# Patient Record
Sex: Male | Born: 1950
Health system: Southern US, Community
[De-identification: ages and names within clinical notes are randomized; demographics above are authoritative.]

## PROBLEM LIST (undated history)

## (undated) DIAGNOSIS — R030 Elevated blood-pressure reading, without diagnosis of hypertension: Secondary | ICD-10-CM

## (undated) DIAGNOSIS — I2699 Other pulmonary embolism without acute cor pulmonale: Secondary | ICD-10-CM

## (undated) DIAGNOSIS — E663 Overweight: Secondary | ICD-10-CM

## (undated) DIAGNOSIS — J309 Allergic rhinitis, unspecified: Secondary | ICD-10-CM

## (undated) DIAGNOSIS — N200 Calculus of kidney: Secondary | ICD-10-CM

## (undated) DIAGNOSIS — I480 Paroxysmal atrial fibrillation: Secondary | ICD-10-CM

## (undated) DIAGNOSIS — I491 Atrial premature depolarization: Secondary | ICD-10-CM

## (undated) DIAGNOSIS — R0609 Other forms of dyspnea: Secondary | ICD-10-CM

## (undated) DIAGNOSIS — E785 Hyperlipidemia, unspecified: Secondary | ICD-10-CM

## (undated) HISTORY — DX: Elevated blood-pressure reading, without diagnosis of hypertension: R03.0

## (undated) HISTORY — DX: Other pulmonary embolism without acute cor pulmonale: I26.99

## (undated) HISTORY — DX: Overweight: E66.3

## (undated) HISTORY — DX: Allergic rhinitis, unspecified: J30.9

## (undated) HISTORY — DX: Paroxysmal atrial fibrillation: I48.0

## (undated) HISTORY — PX: TRANSTHORACIC ECHOCARDIOGRAM: SHX275

## (undated) HISTORY — DX: Hyperlipidemia, unspecified: E78.5

## (undated) HISTORY — DX: Calculus of kidney: N20.0

## (undated) HISTORY — DX: Other forms of dyspnea: R06.09

## (undated) HISTORY — DX: Atrial premature depolarization: I49.1

---

## 2018-02-04 DIAGNOSIS — L821 Other seborrheic keratosis: Secondary | ICD-10-CM | POA: Diagnosis not present

## 2018-02-04 DIAGNOSIS — D225 Melanocytic nevi of trunk: Secondary | ICD-10-CM | POA: Diagnosis not present

## 2018-02-04 DIAGNOSIS — L814 Other melanin hyperpigmentation: Secondary | ICD-10-CM | POA: Diagnosis not present

## 2018-02-04 DIAGNOSIS — D1801 Hemangioma of skin and subcutaneous tissue: Secondary | ICD-10-CM | POA: Diagnosis not present

## 2018-03-09 DIAGNOSIS — H6980 Other specified disorders of Eustachian tube, unspecified ear: Secondary | ICD-10-CM | POA: Diagnosis not present

## 2018-09-17 DIAGNOSIS — L814 Other melanin hyperpigmentation: Secondary | ICD-10-CM | POA: Diagnosis not present

## 2018-09-17 DIAGNOSIS — D225 Melanocytic nevi of trunk: Secondary | ICD-10-CM | POA: Diagnosis not present

## 2018-09-17 DIAGNOSIS — L821 Other seborrheic keratosis: Secondary | ICD-10-CM | POA: Diagnosis not present

## 2018-09-17 DIAGNOSIS — I8393 Asymptomatic varicose veins of bilateral lower extremities: Secondary | ICD-10-CM | POA: Diagnosis not present

## 2018-10-08 DIAGNOSIS — Z23 Encounter for immunization: Secondary | ICD-10-CM | POA: Diagnosis not present

## 2018-11-10 HISTORY — PX: OTHER SURGICAL HISTORY: SHX169

## 2018-11-10 HISTORY — PX: TRANSTHORACIC ECHOCARDIOGRAM: SHX275

## 2018-11-13 DIAGNOSIS — R0602 Shortness of breath: Secondary | ICD-10-CM | POA: Diagnosis not present

## 2018-11-18 ENCOUNTER — Encounter: Payer: Self-pay | Admitting: Cardiology

## 2018-11-18 DIAGNOSIS — I499 Cardiac arrhythmia, unspecified: Secondary | ICD-10-CM | POA: Diagnosis not present

## 2018-11-18 DIAGNOSIS — E785 Hyperlipidemia, unspecified: Secondary | ICD-10-CM | POA: Diagnosis not present

## 2018-11-18 DIAGNOSIS — R5383 Other fatigue: Secondary | ICD-10-CM | POA: Diagnosis not present

## 2018-11-18 DIAGNOSIS — I10 Essential (primary) hypertension: Secondary | ICD-10-CM | POA: Diagnosis not present

## 2018-11-18 DIAGNOSIS — Z131 Encounter for screening for diabetes mellitus: Secondary | ICD-10-CM | POA: Diagnosis not present

## 2018-11-21 ENCOUNTER — Other Ambulatory Visit: Payer: Self-pay | Admitting: Physician Assistant

## 2018-11-21 DIAGNOSIS — I499 Cardiac arrhythmia, unspecified: Secondary | ICD-10-CM

## 2018-11-28 ENCOUNTER — Other Ambulatory Visit: Payer: Self-pay | Admitting: Physician Assistant

## 2018-11-28 ENCOUNTER — Ambulatory Visit (INDEPENDENT_AMBULATORY_CARE_PROVIDER_SITE_OTHER): Payer: BLUE CROSS/BLUE SHIELD

## 2018-11-28 DIAGNOSIS — R0602 Shortness of breath: Secondary | ICD-10-CM | POA: Diagnosis not present

## 2018-11-28 DIAGNOSIS — I499 Cardiac arrhythmia, unspecified: Secondary | ICD-10-CM | POA: Diagnosis not present

## 2018-11-29 DIAGNOSIS — R0602 Shortness of breath: Secondary | ICD-10-CM | POA: Diagnosis not present

## 2018-11-29 DIAGNOSIS — I499 Cardiac arrhythmia, unspecified: Secondary | ICD-10-CM | POA: Diagnosis not present

## 2018-12-01 ENCOUNTER — Emergency Department (HOSPITAL_COMMUNITY): Payer: BLUE CROSS/BLUE SHIELD

## 2018-12-01 ENCOUNTER — Observation Stay (HOSPITAL_COMMUNITY)
Admission: EM | Admit: 2018-12-01 | Discharge: 2018-12-05 | DRG: 176 | Disposition: A | Discharge status: Home / Self Care | Payer: BLUE CROSS/BLUE SHIELD | Attending: Internal Medicine | Admitting: Internal Medicine

## 2018-12-01 ENCOUNTER — Encounter (HOSPITAL_COMMUNITY): Payer: Self-pay | Admitting: Emergency Medicine

## 2018-12-01 ENCOUNTER — Other Ambulatory Visit: Payer: Self-pay

## 2018-12-01 DIAGNOSIS — R7989 Other specified abnormal findings of blood chemistry: Secondary | ICD-10-CM | POA: Diagnosis not present

## 2018-12-01 DIAGNOSIS — N189 Chronic kidney disease, unspecified: Secondary | ICD-10-CM | POA: Diagnosis present

## 2018-12-01 DIAGNOSIS — R509 Fever, unspecified: Secondary | ICD-10-CM

## 2018-12-01 DIAGNOSIS — I340 Nonrheumatic mitral (valve) insufficiency: Secondary | ICD-10-CM | POA: Diagnosis not present

## 2018-12-01 DIAGNOSIS — I2694 Multiple subsegmental pulmonary emboli without acute cor pulmonale: Secondary | ICD-10-CM | POA: Diagnosis not present

## 2018-12-01 DIAGNOSIS — I493 Ventricular premature depolarization: Secondary | ICD-10-CM | POA: Diagnosis not present

## 2018-12-01 DIAGNOSIS — I2699 Other pulmonary embolism without acute cor pulmonale: Secondary | ICD-10-CM

## 2018-12-01 DIAGNOSIS — I370 Nonrheumatic pulmonary valve stenosis: Secondary | ICD-10-CM | POA: Diagnosis not present

## 2018-12-01 DIAGNOSIS — I2609 Other pulmonary embolism with acute cor pulmonale: Secondary | ICD-10-CM | POA: Diagnosis present

## 2018-12-01 DIAGNOSIS — I480 Paroxysmal atrial fibrillation: Secondary | ICD-10-CM

## 2018-12-01 DIAGNOSIS — N179 Acute kidney failure, unspecified: Secondary | ICD-10-CM | POA: Diagnosis present

## 2018-12-01 DIAGNOSIS — R0902 Hypoxemia: Secondary | ICD-10-CM | POA: Diagnosis not present

## 2018-12-01 DIAGNOSIS — I4891 Unspecified atrial fibrillation: Secondary | ICD-10-CM

## 2018-12-01 DIAGNOSIS — W541XXA Struck by dog, initial encounter: Secondary | ICD-10-CM | POA: Diagnosis not present

## 2018-12-01 DIAGNOSIS — J984 Other disorders of lung: Secondary | ICD-10-CM | POA: Diagnosis not present

## 2018-12-01 DIAGNOSIS — I82452 Acute embolism and thrombosis of left peroneal vein: Secondary | ICD-10-CM | POA: Diagnosis present

## 2018-12-01 DIAGNOSIS — Z791 Long term (current) use of non-steroidal anti-inflammatories (NSAID): Secondary | ICD-10-CM | POA: Diagnosis not present

## 2018-12-01 DIAGNOSIS — R0602 Shortness of breath: Secondary | ICD-10-CM | POA: Diagnosis not present

## 2018-12-01 DIAGNOSIS — I129 Hypertensive chronic kidney disease with stage 1 through stage 4 chronic kidney disease, or unspecified chronic kidney disease: Secondary | ICD-10-CM | POA: Diagnosis present

## 2018-12-01 DIAGNOSIS — I361 Nonrheumatic tricuspid (valve) insufficiency: Secondary | ICD-10-CM | POA: Diagnosis not present

## 2018-12-01 HISTORY — DX: Other pulmonary embolism without acute cor pulmonale: I26.99

## 2018-12-01 LAB — HEPARIN LEVEL (UNFRACTIONATED): Heparin Unfractionated: 0.39 IU/mL (ref 0.30–0.70)

## 2018-12-01 LAB — MAGNESIUM: Magnesium: 2.2 mg/dL (ref 1.7–2.4)

## 2018-12-01 LAB — COMPREHENSIVE METABOLIC PANEL
ALT: 27 U/L (ref 0–44)
AST: 55 U/L — ABNORMAL HIGH (ref 15–41)
Albumin: 3.7 g/dL (ref 3.5–5.0)
Alkaline Phosphatase: 58 U/L (ref 38–126)
Anion gap: 16 — ABNORMAL HIGH (ref 5–15)
BUN: 20 mg/dL (ref 8–23)
CO2: 21 mmol/L — ABNORMAL LOW (ref 22–32)
Calcium: 9.6 mg/dL (ref 8.9–10.3)
Chloride: 102 mmol/L (ref 98–111)
Creatinine, Ser: 1.43 mg/dL — ABNORMAL HIGH (ref 0.61–1.24)
GFR calc Af Amer: 59 mL/min — ABNORMAL LOW (ref >60)
GFR calc non Af Amer: 51 mL/min — ABNORMAL LOW (ref >60)
Glucose, Bld: 144 mg/dL — ABNORMAL HIGH (ref 70–99)
Potassium: 3.9 mmol/L (ref 3.5–5.1)
Sodium: 139 mmol/L (ref 135–145)
Total Bilirubin: 1.2 mg/dL (ref 0.3–1.2)
Total Protein: 7.7 g/dL (ref 6.5–8.1)

## 2018-12-01 LAB — CBC
HCT: 49.8 % (ref 39.0–52.0)
Hemoglobin: 16.5 g/dL (ref 13.0–17.0)
MCH: 28.4 pg (ref 26.0–34.0)
MCHC: 33.1 g/dL (ref 30.0–36.0)
MCV: 85.6 fL (ref 80.0–100.0)
Platelets: 280 10*3/uL (ref 150–400)
RBC: 5.82 MIL/uL — ABNORMAL HIGH (ref 4.22–5.81)
RDW: 13.2 % (ref 11.5–15.5)
WBC: 13.7 10*3/uL — ABNORMAL HIGH (ref 4.0–10.5)
nRBC: 0 % (ref 0.0–0.2)

## 2018-12-01 LAB — ANTITHROMBIN III: AntiThromb III Func: 86 % (ref 75–120)

## 2018-12-01 LAB — TROPONIN I: Troponin I: 0.21 ng/mL (ref <0.03)

## 2018-12-01 MED ORDER — DILTIAZEM HCL-DEXTROSE 100-5 MG/100ML-% IV SOLN (PREMIX)
5.0000 mg/h | INTRAVENOUS | Status: DC
  Administered 2018-12-01: 5 mg/h via INTRAVENOUS
  Administered 2018-12-01 – 2018-12-02 (×2): 10 mg/h via INTRAVENOUS
  Filled 2018-12-01 (×4): qty 100

## 2018-12-01 MED ORDER — IOPAMIDOL (ISOVUE-370) INJECTION 76%
INTRAVENOUS | Status: AC
  Administered 2018-12-01: 75 mL
  Filled 2018-12-01: qty 100

## 2018-12-01 MED ORDER — HEPARIN BOLUS VIA INFUSION
5000.0000 [IU] | Freq: Once | INTRAVENOUS | Status: AC
  Administered 2018-12-01: 5000 [IU] via INTRAVENOUS
  Filled 2018-12-01: qty 5000

## 2018-12-01 MED ORDER — DILTIAZEM LOAD VIA INFUSION
10.0000 mg | Freq: Once | INTRAVENOUS | Status: AC
  Administered 2018-12-01: 10 mg via INTRAVENOUS
  Filled 2018-12-01: qty 10

## 2018-12-01 MED ORDER — ACETAMINOPHEN 650 MG RE SUPP: 650.0000 mg | Freq: Four times a day (QID) | RECTAL | Status: DC | PRN

## 2018-12-01 MED ORDER — HEPARIN (PORCINE) 25000 UT/250ML-% IV SOLN
1900.0000 [IU]/h | INTRAVENOUS | Status: DC
  Administered 2018-12-01: 1600 [IU]/h via INTRAVENOUS
  Administered 2018-12-02: 1800 [IU]/h via INTRAVENOUS
  Administered 2018-12-02: 1600 [IU]/h via INTRAVENOUS
  Administered 2018-12-03: 1900 [IU]/h via INTRAVENOUS
  Administered 2018-12-03: 1800 [IU]/h via INTRAVENOUS
  Administered 2018-12-04: 1900 [IU]/h via INTRAVENOUS
  Filled 2018-12-01 (×6): qty 250

## 2018-12-01 MED ORDER — ACETAMINOPHEN 325 MG PO TABS
650.0000 mg | ORAL_TABLET | Freq: Four times a day (QID) | ORAL | Status: DC | PRN
  Administered 2018-12-03 (×2): 650 mg via ORAL
  Filled 2018-12-01 (×2): qty 2

## 2018-12-01 NOTE — H&P (Addendum)
PCP:  Dr Delton SeeNelson  Chief Complaint: Shortness of breath   HPI: this is a 67 y/o male who presents with c/o SOB from early November. He works out and noted his stamina dropped off. He saw his PCP, a heart monitor was placed after Thanksgiving. Turned in this Friday. He continued to be very SOB, taking a shower, walking upstairs he is SOB. This morning he was aggravated by the dog and that set off a chain of events where he was persistently SOB and unable to recover. He finally decided to come into the ER. He travels frequently, he flies frequently 2 or 3 times a month. He denies any calf swelling. He denies any hemoptysis. He reports a cough when he loses his breath, no wheezing. No fevers or chills  In the ER his CTA is significant for a significant PE.  History provided by the patient who is alert and oriented  Review of Systems:  The patient denies anorexia, fever, weight loss, SOB, vision loss, decreased hearing, hoarseness, chest pain, syncope, dyspnea on exertion, peripheral edema, balance deficits, hemoptysis, abdominal pain, melena, hematochezia, severe indigestion/heartburn, hematuria, incontinence, genital sores, muscle weakness, suspicious skin lesions, transient blindness, difficulty walking, depression, unusual weight change, abnormal bleeding, enlarged lymph nodes, angioedema, and breast masses.  Past Medical History: History reviewed. No pertinent past medical history. History reviewed. No pertinent surgical history.  Medications: Prior to Admission medications   Medication Sig Start Date End Date Taking? Authorizing Provider  naproxen sodium (ALEVE) 220 MG tablet Take 440 mg by mouth as needed (pain).   Yes [provider]    Allergies: No known drug allergy  Social History:  reports that he has never smoked. He has never used smokeless tobacco. No history on file for alcohol and drug.  Family History: History reviewed. No pertinent family history.  Physical  Exam: Vitals:   12/01/18 1430 12/01/18 1500 12/01/18 1520 12/01/18 1533  BP: 103/80 103/71 103/71 104/75  Pulse:  (!) 118 (!) 103 (!) 103  Resp:  (!) 25 (!) 26 18  Temp:      TempSrc:      SpO2: 96% 93% 95% 94%  Weight:      Height:        General:  Alert and oriented times three, well developed and nourished, no acute distress Eyes: PERRLA, pink conjunctiva, no scleral icterus ENT: Moist oral mucosa, neck supple, no thyromegaly Lungs: clear to ascultation, no wheeze, no crackles, no use of accessory muscles Cardiovascular: regular rate and rhythm, no regurgitation, no gallops, no murmurs. No carotid bruits, no JVD Abdomen: soft, positive BS, non-tender, non-distended, no organomegaly, not an acute abdomen GU: not examined Neuro: CN II - XII grossly intact, sensation intact Musculoskeletal: strength 5/5 all extremities, no clubbing, cyanosis or edema Skin: no rash, no subcutaneous crepitation, no decubitus Psych: appropriate patient   Labs on Admission:  Recent Labs    12/01/18 1217  NA 139  K 3.9  CL 102  CO2 21*  GLUCOSE 144*  BUN 20  CREATININE 1.43*  CALCIUM 9.6  MG 2.2   Recent Labs    12/01/18 1217  AST 55*  ALT 27  ALKPHOS 58  BILITOT 1.2  PROT 7.7  ALBUMIN 3.7   No results for input(s): LIPASE, AMYLASE in the last 72 hours. Recent Labs    12/01/18 1217  WBC 13.7*  HGB 16.5  HCT 49.8  MCV 85.6  PLT 280   Recent Labs    12/01/18 1217  TROPONINI 0.21*   Invalid input(s): POCBNP No results for input(s): DDIMER in the last 72 hours. No results for input(s): HGBA1C in the last 72 hours. No results for input(s): CHOL, HDL, LDLCALC, TRIG, CHOLHDL, LDLDIRECT in the last 72 hours. No results for input(s): TSH, T4TOTAL, T3FREE, THYROIDAB in the last 72 hours.  Invalid input(s): FREET3 No results for input(s): VITAMINB12, FOLATE, FERRITIN, TIBC, IRON, RETICCTPCT in the last 72 hours.  Micro Results: No results found for this or any previous visit  (from the past 240 hour(s)).   Radiological Exams on Admission: Ct Angio Chest Pe W And/or Wo Contrast  Result Date: 12/01/2018 CLINICAL DATA:  Short of breath.  Acute onset. EXAM: CT ANGIOGRAPHY CHEST WITH CONTRAST TECHNIQUE: Multidetector CT imaging of the chest was performed using the standard protocol during bolus administration of intravenous contrast. Multiplanar CT image reconstructions and MIPs were obtained to evaluate the vascular anatomy. CONTRAST:  75mL ISOVUE-370 IOPAMIDOL (ISOVUE-370) INJECTION 76% COMPARISON:  None. FINDINGS: Cardiovascular: There are large filling defects within the distal LEFT and RIGHT pulmonary arteries which are occlusive. Emboli extend into the lower lobe pulmonary arteries and upper lobe pulmonary arteries. Emboli extend into the RIGHT middle lobe and lingula. Overall clot burden is severe. There is evidence of RIGHT ventricular strain with the ratio of the RIGHT ventricular diameter ratio to LEFT ventricular diameter greater than 1 (RV/LV equal 1.44). There is flattening of the intraventricular septum along the RIGHT ventricular interface. Mediastinum/Nodes: No axillary supraclavicular adenopathy. No mediastinal hilar adenopathy. No pericardial effusion. Lungs/Pleura: There is fluffy airspace disease in the posterior segment of the RIGHT lower lobe. Subtle peripheral airspace disease in the lateral segment of the RIGHT lower lobe (image 59/7). Calcified nodule in the LEFT upper lobe is small. Upper Abdomen: Limited view of the liver, kidneys, pancreas are unremarkable. Normal adrenal glands. Musculoskeletal: No aggressive osseous lesion. Review of the MIP images confirms the above findings. IMPRESSION: 1. Bilateral severe occlusive pulmonary emboli involving the proximal pulmonary arteries and a majority of the distal pulmonary arteries. 2. Positive for acute PE with CT evidence of right heart strain (RV/LV Ratio = 1.4) consistent with at least submassive (intermediate  risk) PE. The presence of right heart strain has been associated with an increased risk of morbidity and mortality. Please activate Code PE by paging [phone]. 3. Airspace disease in the posterior RIGHT lower lobe and to lesser degree the lateral RIGHT lower lobe is presumed mild pulmonary infarction. Critical Value/emergent results were called by telephone at the time of interpretation on 12/01/2018 at 2:43 pm to Dr. Virgina NorfolkAdam Curatolo, who verbally acknowledged these results. Electronically Signed   By: Genevive BiStewart  Edmunds M.D.   On: 12/01/2018 14:46   Dg Chest Port 1 View  Result Date: 12/01/2018 CLINICAL DATA:  Shortness of breath. EXAM: PORTABLE CHEST 1 VIEW COMPARISON:  None. FINDINGS: Lungs are adequately inflated with mild hazy density over the right upper lobe/apex which may be true parenchymal process versus overlapping bony and vascular structures. No effusion. Cardiomediastinal silhouette and remainder the exam is unremarkable. IMPRESSION: Mild hazy density over the right upper lobe/apex which may be true parenchymal process versus overlapping bony and vascular structures. Consider PA and lateral chest radiograph for further evaluation. Electronically Signed   By: Elberta Fortisaniel  Boyle M.D.   On: 12/01/2018 13:02    Assessment/Plan Present on Admission: . Pulmonary embolus (HCC) -Admit to progressive unit -Heparin drip, 2D echo to evaluate right heart strain -Oxygen to keep sats greater than 88%  -Duo  nebs, respiratory to evaluate and treat -Patient discussed with interventional radiology as well as critical care pulmonary.  Thrombolytics is not commended and patient is stable to be admitted to stepdown -Lower extremity Dopplers ordered -Hypercoagulable work-up initiated -PE most likely due to patient's travel  Atrial fibrillation -Cardiology consult in a.m. -Continue diltiazem drip  Dejai Schubach 12/01/2018, 3:53 PM

## 2018-12-01 NOTE — ED Triage Notes (Addendum)
Pt arrives to ED from home with complaints of shortness of breath suddenly starting today when his dog jumped on him. Pt reports he has no cardiac or respiratory history but has been having this SOB episodes for over a month. Pt very labored on arrival, in atrial fibrillation with RVR, HR in 160's. Pt stated he takes long trips for work but no swelling in legs or arms noted. Pt placed on 2L [place]. Pt placed in position of comfort with bed locked and lowered, call bell in reach.

## 2018-12-01 NOTE — ED Provider Notes (Signed)
Medical screening examination/treatment/procedure(s) were conducted as a shared visit with non-physician practitioner(s) and myself.  I personally evaluated the patient during the encounter. Briefly, the patient is a [age] y.o. male with no significant medical history presents the ED with shortness of breath.  Patient arrives with tachycardia in the 170s.  Hypoxic to the high 80s.  Normal blood pressure.  EKG shows atrial fibrillation with rapid ventricular rate.  Patient has had shortness of breath for the last several weeks.  Has just finished wearing a heart monitor.  Is unable to give a clear picture of when palpitations may have started.  He states that his symptoms are worse with exertion.  He denies any chest pain.  Patient does travel a lot for work.  No history of PE.  Patient has family history of atrial fibrillation.  Denies any infectious symptoms.  Patient has fairly clear breath sounds on exam.  No signs of volume overload.  No leg edema.  Will start IV diltiazem, diltiazem bolus for tachycardia.  Concern for possible PE.  Troponin elevated.  Radiology called in the phone to discuss CTA findings that showed submassive PE with bilateral PEs with right heart strain.  Patient was started on heparin bolus.  Critical care was consulted and they recommend continuing diltiazem as patient's hemodynamics have improved while on it. Back on room air.   IR was consulted and I talked with them on the phone and at this time given normal hemodynamics they recommend echocardiogram and will hold on tPA at this time and will be on standby if patient needs any further treatment.  Will consult hospitalist for admission for further care.  Remained hemodynamically stable throughout my care.  This chart was dictated using voice recognition software.  Despite best efforts to proofread,  errors can occur which can change the documentation meaning.  .Critical Care Performed by: Virgina Norfolkuratolo, Mercy Malena, DO Authorized by: Virgina Norfolkuratolo,  Nihal Marzella, DO   Critical care provider statement:    Critical care time (minutes):  35   Critical care time was exclusive of:  Separately billable procedures and treating other patients and teaching time   Critical care was necessary to treat or prevent imminent or life-threatening deterioration of the following conditions:  Respiratory failure   Critical care was time spent personally by me on the following activities:  Development of treatment plan with patient or surrogate, discussions with consultants, evaluation of patient's response to treatment, obtaining history from patient or surrogate, ordering and performing treatments and interventions, ordering and review of laboratory studies, ordering and review of radiographic studies, pulse oximetry, re-evaluation of patient's condition and review of old charts   I assumed direction of critical care for this patient from another provider in my specialty: no       EKG Interpretation  Date/Time:  Sunday December 01 2018 12:04:38 EST Ventricular Rate:  158 PR Interval:    QRS Duration: 95 QT Interval:  294 QTC Calculation: 477 R Axis:   74 Text Interpretation:  Atrial fibrillation with rapid V-rate Ventricular premature complex Repolarization abnormality, prob rate related Confirmed by Virgina NorfolkAdam, Juelz Whittenberg [phone]([id number]) on 12/01/2018 12:06:46 PM Also confirmed by Virgina NorfolkAdam, Ka Bench [phone]), editor Barbette Hairassel, Kerry [phone])  on 12/01/2018 12:27:47 PM            Virgina Norfolkuratolo, Toniann Dickerson, DO 12/01/18 1512

## 2018-12-01 NOTE — Progress Notes (Signed)
ANTICOAGULATION CONSULT NOTE   Pharmacy Consult for Heparin Indication: atrial fibrillation  Not on File  Patient Measurements: Height: 6\' 4"  (193 cm) Weight: 224 lb (101.6 kg) IBW/kg (Calculated) : 86.8 Heparin Dosing Weight: 102 kg  Vital Signs: Temp: 99.6 F (37.6 C) (12/22 2002) Temp Source: Oral (12/22 2002) BP: 133/71 (12/22 1900) Pulse Rate: 87 (12/22 1900)  Labs: Recent Labs    12/01/18 1217 12/01/18 2131  HGB 16.5  --   HCT 49.8  --   PLT 280  --   HEPARINUNFRC  --  0.39  CREATININE 1.43*  --   TROPONINI 0.21*  --     Estimated Creatinine Clearance: 63.2 mL/min (A) (by C-G formula based on SCr of 1.43 mg/dL (H)).   Medical History: History reviewed. No pertinent past medical history.  Assessment: 67 y.o. M on heparin for afib with RVR. Heparin level therapeutic (0.39) on gtt at 1600 units/hr. No bleeding noted.  Goal of Therapy:  Heparin level 0.3-0.7 units/ml Monitor platelets by anticoagulation protocol: Yes   Plan:  Heparin gtt at 1600 units/hr Daily heparin level and CBC   Christoper Fabianaron Jerzy Crotteau, PharmD, BCPS Clinical pharmacist  **Pharmacist phone directory can now be found on [email or web address] ([email or web address]).  Listed under [facility]. 12/01/2018,10:10 PM

## 2018-12-01 NOTE — Progress Notes (Signed)
ANTICOAGULATION CONSULT NOTE - Initial Consult  Pharmacy Consult for Heparin Indication: atrial fibrillation  Not on File  Patient Measurements: Height: 6\' 4"  (193 cm) Weight: 224 lb (101.6 kg) IBW/kg (Calculated) : 86.8 Heparin Dosing Weight: 102 kg  Vital Signs: Temp: 98.3 F (36.8 C) (12/22 1207) Temp Source: Oral (12/22 1207) BP: 131/85 (12/22 1427) Pulse Rate: 128 (12/22 1427)  Labs: Recent Labs    12/01/18 1217  HGB 16.5  HCT 49.8  PLT 280  CREATININE 1.43*  TROPONINI 0.21*    Estimated Creatinine Clearance: 63.2 mL/min (A) (by C-G formula based on SCr of 1.43 mg/dL (H)).   Medical History: History reviewed. No pertinent past medical history.  Medications:  Only on naproxen at home  Assessment: 67 y.o. M presents in afib with RVR. To begin heparin. No AC PTA. No signigicant PMH.  Goal of Therapy:  Heparin level 0.3-0.7 units/ml Monitor platelets by anticoagulation protocol: Yes   Plan:  Heparin IV bolus 5000 units Heparin gtt at 1600 units/hr Will f/u heparin level in 6 hours Daily heparin level and CBC   Christoper Fabianaron Dequita Schleicher, PharmD, BCPS Clinical pharmacist  **Pharmacist phone directory can now be found on [email or web address] ([email or web address]).  [facility] under [facility]. 12/01/2018,2:47 PM

## 2018-12-01 NOTE — ED Provider Notes (Signed)
[facility] EMERGENCY DEPARTMENT Provider Note   CSN: [id number] Arrival date & time: 12/01/18  1156     History   Chief Complaint Chief Complaint  Patient presents with  . Shortness of Breath  . Atrial Fibrillation    HPI [name] is a 67 y.o. male.  The history is provided by the patient. No language interpreter was used.  Shortness of Breath  This is a new problem. The problem occurs frequently.The current episode started more than 1 week ago. The problem has not changed since onset.Pertinent negatives include no fever, no headaches, no sputum production and no chest pain. He has tried nothing for the symptoms. The treatment provided no relief. He has had no prior ED visits. He has had no prior ICU admissions. Associated medical issues do not include asthma, COPD, pneumonia, CAD or past MI.  Atrial Fibrillation  Associated symptoms include shortness of breath. Pertinent negatives include no chest pain and no headaches.  Pt reports he recently saw cardiology for exertional fatique.  Pt reports over the past month he has had some shortness of breath with any exertion.  Pt does not recall heart breathing fast until today  History reviewed. No pertinent past medical history.  There are no active problems to display for this patient.   History reviewed. No pertinent surgical history.      Home Medications    Prior to Admission medications   Medication Sig Start Date End Date Taking? Authorizing Provider  naproxen sodium (ALEVE) 220 MG tablet Take 440 mg by mouth as needed (pain).   Yes [provider]    Family History History reviewed. No pertinent family history.  Social History Social History   Tobacco Use  . Smoking status: Never Smoker  . Smokeless tobacco: Never Used  Substance Use Topics  . Alcohol use: Not on file  . Drug use: Not on file     Allergies   Patient has no allergy information on record.   Review of  Systems Review of Systems  Constitutional: Negative for fever.  Respiratory: Positive for shortness of breath. Negative for sputum production.   Cardiovascular: Negative for chest pain.  Neurological: Negative for headaches.  All other systems reviewed and are negative.    Physical Exam Updated Vital Signs BP (!) 115/95   Pulse 77   Temp 98.3 F (36.8 C) (Oral)   Resp (!) 24   Ht 6\' 4"  (1.93 m)   Wt 101.6 kg   SpO2 96%   BMI 27.27 kg/m   Physical Exam Vitals signs and nursing note reviewed.  Constitutional:      Appearance: He is well-developed.  HENT:     Head: Normocephalic and atraumatic.     Mouth/Throat:     Mouth: Mucous membranes are moist.  Eyes:     Pupils: Pupils are equal, round, and reactive to light.  Neck:     Musculoskeletal: Normal range of motion.  Cardiovascular:     Rate and Rhythm: Tachycardia present.  Pulmonary:     Effort: Pulmonary effort is normal.     Breath sounds: Normal breath sounds. No decreased breath sounds.  Chest:     Chest wall: Deformity present. No mass.  Abdominal:     Palpations: Abdomen is soft.  Musculoskeletal: Normal range of motion.  Skin:    General: Skin is warm.     Capillary Refill: Capillary refill takes less than 2 seconds.  Neurological:     General: No focal  deficit present.     Mental Status: He is alert.  Psychiatric:        Mood and Affect: Mood normal.      ED Treatments / Results  Labs (all labs ordered are listed, but only abnormal results are displayed) Labs Reviewed  CBC - Abnormal; Notable for the following components:      Result Value   WBC 13.7 (*)    RBC 5.82 (*)    All other components within normal limits  MAGNESIUM  COMPREHENSIVE METABOLIC PANEL  TROPONIN I    EKG EKG Interpretation  Date/Time:  "Sunday December 01 2018 12:04:38 EST Ventricular Rate:  158 PR Interval:    QRS Duration: 95 QT Interval:  294 QTC Calculation: 477 R Axis:   74 Text Interpretation:  Atrial  fibrillation with rapid V-rate Ventricular premature complex Repolarization abnormality, prob rate related Confirmed by Adam, Curatolo ([id number]) on 12/01/2018 12:06:46 PM Also confirmed by Adam, Curatolo ([id number]), editor Cassel, Kerry ([id number])  on 12/01/2018 12:27:47 PM   Radiology No results found.  Procedures .Critical Care Performed by: Nettie Wyffels K, PA-C Authorized by: Crimson Beer K, PA-C   Critical care provider statement:    Critical care time (minutes):  45   Critical care start time:  12/01/2018 1:00 PM   Critical care end time:  12/01/2018 3:08 PM   Critical care time was exclusive of:  Separately billable procedures and treating other patients   Critical care was necessary to treat or prevent imminent or life-threatening deterioration of the following conditions:  Cardiac failure and circulatory failure   Critical care was time spent personally by me on the following activities:  Discussions with consultants, evaluation of patient's response to treatment, examination of patient, ordering and performing treatments and interventions, ordering and review of laboratory studies, ordering and review of radiographic studies, pulse oximetry, re-evaluation of patient's condition, obtaining history from patient or surrogate and review of old charts   (including critical care time)  Medications Ordered in ED Medications  diltiazem (CARDIZEM) 1 mg/mL load via infusion 10 mg (has no administration in time range)    And  diltiazem (CARDIZEM) 100 mg in dextrose 5% 100mL (1 mg/mL) infusion (has no administration in time range)     Initial Impression / Assessment and Plan / ED Course  I have reviewed the triage vital signs and the nursing notes.  Pertinent labs & imaging results that were available during my care of the patient were reviewed by me and considered in my medical decision making (see chart for details).  Clinical Course as of Dec 01 1504  Sun Dec 01, 2018  1448 CT Angio  Chest PE W and/or Wo Contrast [LS]    Clinical Course User Index [LS] Jenin Birdsall K, PA-C    MDM  Pt started on diltiazem.   Heart rate decreased to 110.  Pt reports he is breathing much better.  Ct scan shows bilat PE's with heart strain.  Pt started on Heparin.   Dr. Yamagato consulted.   Dr. Miller Critical consulted.  He advised he would not give TPA.  He advised heparin and Medicine admission to stepdown unit Hospitalist consulted   Final Clinical Impressions(s) / ED Diagnoses   Final diagnoses:  Multiple subsegmental pulmonary emboli without acute cor pulmonale  Atrial fibrillation, unspecified type (HCC)    ED Discharge Orders         Ordered    Amb referral to AFIB Clinic     12" /22/19 1217  Elson AreasSofia, Monasia Lair K, PA-C 12/01/18 1522    Virgina Norfolkuratolo, Adam, DO 12/01/18 1659

## 2018-12-02 ENCOUNTER — Inpatient Hospital Stay (HOSPITAL_COMMUNITY): Payer: BLUE CROSS/BLUE SHIELD

## 2018-12-02 DIAGNOSIS — I361 Nonrheumatic tricuspid (valve) insufficiency: Secondary | ICD-10-CM

## 2018-12-02 DIAGNOSIS — I340 Nonrheumatic mitral (valve) insufficiency: Secondary | ICD-10-CM

## 2018-12-02 DIAGNOSIS — I2694 Multiple subsegmental pulmonary emboli without acute cor pulmonale: Secondary | ICD-10-CM | POA: Diagnosis not present

## 2018-12-02 DIAGNOSIS — I2609 Other pulmonary embolism with acute cor pulmonale: Secondary | ICD-10-CM | POA: Diagnosis not present

## 2018-12-02 DIAGNOSIS — I480 Paroxysmal atrial fibrillation: Secondary | ICD-10-CM

## 2018-12-02 DIAGNOSIS — R7989 Other specified abnormal findings of blood chemistry: Secondary | ICD-10-CM

## 2018-12-02 DIAGNOSIS — I370 Nonrheumatic pulmonary valve stenosis: Secondary | ICD-10-CM

## 2018-12-02 DIAGNOSIS — I2699 Other pulmonary embolism without acute cor pulmonale: Secondary | ICD-10-CM

## 2018-12-02 HISTORY — PX: OTHER SURGICAL HISTORY: SHX169

## 2018-12-02 HISTORY — DX: Paroxysmal atrial fibrillation: I48.0

## 2018-12-02 LAB — BASIC METABOLIC PANEL
Anion gap: 7 (ref 5–15)
BUN: 23 mg/dL (ref 8–23)
CO2: 24 mmol/L (ref 22–32)
Calcium: 8.7 mg/dL — ABNORMAL LOW (ref 8.9–10.3)
Chloride: 107 mmol/L (ref 98–111)
Creatinine, Ser: 1.32 mg/dL — ABNORMAL HIGH (ref 0.61–1.24)
GFR calc Af Amer: >60 mL/min (ref >60)
GFR calc non Af Amer: 56 mL/min — ABNORMAL LOW (ref >60)
Glucose, Bld: 124 mg/dL — ABNORMAL HIGH (ref 70–99)
Potassium: 4.4 mmol/L (ref 3.5–5.1)
Sodium: 138 mmol/L (ref 135–145)

## 2018-12-02 LAB — CBC
HCT: 42 % (ref 39.0–52.0)
Hemoglobin: 14 g/dL (ref 13.0–17.0)
MCH: 28 pg (ref 26.0–34.0)
MCHC: 33.3 g/dL (ref 30.0–36.0)
MCV: 84 fL (ref 80.0–100.0)
Platelets: 257 10*3/uL (ref 150–400)
RBC: 5 MIL/uL (ref 4.22–5.81)
RDW: 13.1 % (ref 11.5–15.5)
WBC: 11 10*3/uL — ABNORMAL HIGH (ref 4.0–10.5)
nRBC: 0 % (ref 0.0–0.2)

## 2018-12-02 LAB — HIV ANTIBODY (ROUTINE TESTING W REFLEX): HIV Screen 4th Generation wRfx: NONREACTIVE

## 2018-12-02 LAB — HEPARIN LEVEL (UNFRACTIONATED)
Heparin Unfractionated: 0.3 IU/mL (ref 0.30–0.70)
Heparin Unfractionated: 0.3 IU/mL (ref 0.30–0.70)

## 2018-12-02 LAB — ECHOCARDIOGRAM COMPLETE
Height: 76 in
Weight: 3584 oz

## 2018-12-02 LAB — MRSA PCR SCREENING: MRSA by PCR: NEGATIVE

## 2018-12-02 MED ORDER — SODIUM CHLORIDE 0.9 % IV SOLN
INTRAVENOUS | Status: DC
  Administered 2018-12-02 – 2018-12-04 (×5): via INTRAVENOUS

## 2018-12-02 MED ORDER — DILTIAZEM HCL 30 MG PO TABS
30.0000 mg | ORAL_TABLET | Freq: Three times a day (TID) | ORAL | Status: DC
  Administered 2018-12-02 – 2018-12-05 (×9): 30 mg via ORAL
  Filled 2018-12-02 (×9): qty 1

## 2018-12-02 NOTE — Care Management (Signed)
#    5.   S/W ROBERT  @  [employer] # [id number]  APIXABAN : NONE FORMULARY  ELIQUIS  10 MG BID : NONE FORMULARY  1. ELIQUIS  2.5 MG BID COVER- YES CO-PAY-$ 45.00 TIER- 2 DRUG PRIOR APPROVAL- NO  2. ELIQUIS  5 MG BID COVER- YES CO-PAY- $ 45.00 TIER - 2 DRUG PRIOR APPROVAL- NO  RIVAROXABAN: NONE FORMULARY  1. XARELTO  15 MG BID COVER- YES CO-PAY- $ 45.00 TIER- 2 DRUG PRIOR APPROVAL- NO  2. XARELTO 20 MG DAILY COVER- YES CO-PAY- $ 45.00 TIER- 2 DRUG PRIOR APPROVAL- NO  NO DEDUCTIBLE  PREFERRED PHARMACY : YES  [facility]

## 2018-12-02 NOTE — Progress Notes (Signed)
LE venous duplex       has been completed. Preliminary results can be found under CV proc through chart review. Phillip LeveringJill Jamerion Cabello, BS, RDMS, RVT    [place] results to [place], [place]

## 2018-12-02 NOTE — Progress Notes (Signed)
  Echocardiogram 2D Echocardiogram has been performed.  Phillip ChimesWendy  Aurthur Dalton 12/02/2018, 11:46 AM

## 2018-12-02 NOTE — Consult Note (Addendum)
Cardiology Consult    Patient ID: Phillip Dalton MRN: [id number], DOB/AGE: 17-Mar-1951   Admit date: 12/01/2018 Date of Consult: 12/02/2018  Primary Physician: No primary care provider on file. Primary Cardiologist: New Consult (Dr. Herbie Baltimore) Requesting Provider: Gery Pray, MD  Patient Profile    Phillip Dalton is a 67 y.o. male with a history of hypertension (no longer on any antihypertensive medications) but no known heart disease, who is being seen today for the evaluation of atrial fibrillation at the request of Dr. Joneen Roach.  History of Present Illness    Phillip Dalton is a 67 year old male with no known history of heart disease. He reports being diagnosed with hypertension about 3 to 4 years ago. He states he did see a Development worker, international aid (at [employer]) at that time and underwent a stress test which was reportedly normal. Patient was on an antihypertensive medication for a while but then stopped taking it, partly due to issues with insurance. He states his blood pressure has been fine since then though. Patient is very active and exercises 3 times a week. He denies any history of exertional chest pain or shortness of breath until about 1 month ago. He reports shortness of breath with exertion that has been getting progressively worse over the last month to the point where he has to stop and catch his breath when walking from one room to another in his house. He saw his PCP for this who ordered a Holter Monitor. Patient turned in monitor on Friday. He states he is scheduled to see a Cardiologist tomorrow but I do not see any appointment in the [facility] system. Patient had a severe episode of shortness of breath yesterday that would not resolve, so patient decided to come to the [facility] ED. Patient reports some lightheadedness and heart racing with episode yesterday but denies any associated chest pain, diaphoresis, or nausea/vomiting. Patient also denies any orthopnea, PND, or lower extremity  edema.  Upon arrival to the ED, patient tachycardic and mildly tachypneic. EKG showed atrial fibrillation with RVR (158 bpm) but no acute ischemic changes. Initial troponin elevated at 0.21. Chest CTA showed bilateral severe occlusive pulmonary emboli involving the proximal pulmonary arteries and a majority of the distal pulmonary arteries as well as evidence of right heart strain (RV/LV Ratio = 1.4) which is consistent with a least a submassive PE. WBC 13.7, Hgb 16.5, Plts 280. Na 139, K 3.9, Glucose 144, SCr 1.43. Patient was started on IV Cardizem and IV Heparin.   Patient denies any history of tobacco use. He has a family history of atrial fibrillation in both his brothers but denise any family history of CAD or stroke.   Past Medical History   History reviewed. No pertinent past medical history.  History reviewed. No pertinent surgical history.   Allergies  Not on File  Inpatient Medications    . diltiazem  30 mg Oral Q8H    Family History    History reviewed. No pertinent family history. has no family status information on file.    Social History    Social History   Socioeconomic History  . Marital status: Married    Spouse name: Not on file  . Number of children: Not on file  . Years of education: Not on file  . Highest education level: Not on file  Occupational History  . Not on file  Social Needs  . Financial resource strain: Not on file  . Food insecurity:    Worry: Not on  file    Inability: Not on file  . Transportation needs:    Medical: Not on file    Non-medical: Not on file  Tobacco Use  . Smoking status: Never Smoker  . Smokeless tobacco: Never Used  Substance and Sexual Activity  . Alcohol use: Not on file  . Drug use: Not on file  . Sexual activity: Not on file  Lifestyle  . Physical activity:    Days per week: Not on file    Minutes per session: Not on file  . Stress: Not on file  Relationships  . Social connections:    Talks on phone:  Not on file    Gets together: Not on file    Attends religious service: Not on file    Active member of club or organization: Not on file    Attends meetings of clubs or organizations: Not on file    Relationship status: Not on file  . Intimate partner violence:    Fear of current or ex partner: Not on file    Emotionally abused: Not on file    Physically abused: Not on file    Forced sexual activity: Not on file  Other Topics Concern  . Not on file  Social History Narrative  . Not on file     Review of Systems    Review of Systems  Constitutional: Positive for chills. Negative for diaphoresis and fever.  HENT: Negative for congestion and sore throat.   Respiratory: Positive for shortness of breath. Negative for cough and sputum production.   Cardiovascular: Positive for palpitations. Negative for chest pain, orthopnea, leg swelling and PND.  Gastrointestinal: Negative for abdominal pain, blood in stool, nausea and vomiting.  Genitourinary: Negative for hematuria.  Musculoskeletal: Negative for myalgias.  Neurological: Negative for dizziness and loss of consciousness.  Endo/Heme/Allergies: Does not bruise/bleed easily.  Psychiatric/Behavioral: Negative for substance abuse.    Physical Exam    Blood pressure (!) 138/99, pulse 78, temperature 97.9 F (36.6 C), temperature source Oral, resp. rate 17, height 6\' 4"  (1.93 m), weight 101.6 kg, SpO2 95 %.  General: 67 y.o. Caucasian male resting comfortably in no acute distress. Pleasant and cooperative. HEENT: Normal  Neck: Supple. No bruits or JVD appreciated. Lungs: No increased work of breathing. Clear to auscultation bilaterally. No wheezes, rhonchi, or rales. Heart: RRR. Distinct S1 and S2. No murmurs, gallops, or rubs.  Abdomen: Soft, non-distended, and non-tender to palpation. Bowel sounds present. Extremities: No clubbing, cyanosis or edema. Varicose veins noted on both legs. Radial and distal pedal pulses 2+ and equal  bilaterally. Neuro: Alert and oriented x3. No focal deficits. Moves all extremities spontaneously. Psych: Normal affect.  Labs    Troponin (Point of Care Test) No results for input(s): TROPIPOC in the last 72 hours. Recent Labs    12/01/18 1217  TROPONINI 0.21*   Lab Results  Component Value Date   WBC 11.0 (H) 12/02/2018   HGB 14.0 12/02/2018   HCT 42.0 12/02/2018   MCV 84.0 12/02/2018   PLT 257 12/02/2018    Recent Labs  Lab 12/01/18 1217 12/02/18 0244  NA 139 138  K 3.9 4.4  CL 102 107  CO2 21* 24  BUN 20 23  CREATININE 1.43* 1.32*  CALCIUM 9.6 8.7*  PROT 7.7  --   BILITOT 1.2  --   ALKPHOS 58  --   ALT 27  --   AST 55*  --   GLUCOSE 144* 124*   No  results found for: CHOL, HDL, LDLCALC, TRIG No results found for: [facility]   Radiology Studies    Ct Angio Chest Pe W And/or Wo Contrast  Result Date: 12/01/2018 CLINICAL DATA:  Short of breath.  Acute onset. EXAM: CT ANGIOGRAPHY CHEST WITH CONTRAST TECHNIQUE: Multidetector CT imaging of the chest was performed using the standard protocol during bolus administration of intravenous contrast. Multiplanar CT image reconstructions and MIPs were obtained to evaluate the vascular anatomy. CONTRAST:  75mL ISOVUE-370 IOPAMIDOL (ISOVUE-370) INJECTION 76% COMPARISON:  None. FINDINGS: Cardiovascular: There are large filling defects within the distal LEFT and RIGHT pulmonary arteries which are occlusive. Emboli extend into the lower lobe pulmonary arteries and upper lobe pulmonary arteries. Emboli extend into the RIGHT middle lobe and lingula. Overall clot burden is severe. There is evidence of RIGHT ventricular strain with the ratio of the RIGHT ventricular diameter ratio to LEFT ventricular diameter greater than 1 (RV/LV equal 1.44). There is flattening of the intraventricular septum along the RIGHT ventricular interface. Mediastinum/Nodes: No axillary supraclavicular adenopathy. No mediastinal hilar adenopathy. No pericardial  effusion. Lungs/Pleura: There is fluffy airspace disease in the posterior segment of the RIGHT lower lobe. Subtle peripheral airspace disease in the lateral segment of the RIGHT lower lobe (image 59/7). Calcified nodule in the LEFT upper lobe is small. Upper Abdomen: Limited view of the liver, kidneys, pancreas are unremarkable. Normal adrenal glands. Musculoskeletal: No aggressive osseous lesion. Review of the MIP images confirms the above findings. IMPRESSION: 1. Bilateral severe occlusive pulmonary emboli involving the proximal pulmonary arteries and a majority of the distal pulmonary arteries. 2. Positive for acute PE with CT evidence of right heart strain (RV/LV Ratio = 1.4) consistent with at least submassive (intermediate risk) PE. The presence of right heart strain has been associated with an increased risk of morbidity and mortality. Please activate Code PE by paging [phone]. 3. Airspace disease in the posterior RIGHT lower lobe and to lesser degree the lateral RIGHT lower lobe is presumed mild pulmonary infarction. Critical Value/emergent results were called by telephone at the time of interpretation on 12/01/2018 at 2:43 pm to Dr. Virgina Norfolk, who verbally acknowledged these results. Electronically Signed   By: Genevive Bi M.D.   On: 12/01/2018 14:46   Dg Chest Port 1 View  Result Date: 12/01/2018 CLINICAL DATA:  Shortness of breath. EXAM: PORTABLE CHEST 1 VIEW COMPARISON:  None. FINDINGS: Lungs are adequately inflated with mild hazy density over the right upper lobe/apex which may be true parenchymal process versus overlapping bony and vascular structures. No effusion. Cardiomediastinal silhouette and remainder the exam is unremarkable. IMPRESSION: Mild hazy density over the right upper lobe/apex which may be true parenchymal process versus overlapping bony and vascular structures. Consider PA and lateral chest radiograph for further evaluation. Electronically Signed   By: Elberta Fortis  M.D.   On: 12/01/2018 13:02   Vas [place] Lower Extremity Venous (dvt)  Result Date: 12/02/2018  Lower Venous Study Risk Factors: Confirmed PE. Performing Technologist: Jeb Levering RDMS, RVT  Examination Guidelines: A complete evaluation includes B-mode imaging, spectral Doppler, color Doppler, and power Doppler as needed of all accessible portions of each vessel. Bilateral testing is considered an integral part of a complete examination. Limited examinations for reoccurring indications may be performed as noted.  Right Venous Findings: +---------+---------------+---------+-----------+----------+-------+          CompressibilityPhasicitySpontaneityPropertiesSummary +---------+---------------+---------+-----------+----------+-------+ CFV      Full           Yes  Yes                          +---------+---------------+---------+-----------+----------+-------+ SFJ      Full                                                 +---------+---------------+---------+-----------+----------+-------+ FV Prox  Full                                                 +---------+---------------+---------+-----------+----------+-------+ FV Mid   Full                                                 +---------+---------------+---------+-----------+----------+-------+ FV DistalFull                                                 +---------+---------------+---------+-----------+----------+-------+ PFV      Full                                                 +---------+---------------+---------+-----------+----------+-------+ POP      Full           Yes      Yes                          +---------+---------------+---------+-----------+----------+-------+ PTV      Full                                                 +---------+---------------+---------+-----------+----------+-------+ PERO     Full                                                  +---------+---------------+---------+-----------+----------+-------+  Left Venous Findings: +---------+---------------+---------+-----------+----------+-------+          CompressibilityPhasicitySpontaneityPropertiesSummary +---------+---------------+---------+-----------+----------+-------+ CFV      Full           Yes      Yes                          +---------+---------------+---------+-----------+----------+-------+ SFJ      Full                                                 +---------+---------------+---------+-----------+----------+-------+ FV Prox  Full                                                 +---------+---------------+---------+-----------+----------+-------+  FV Mid   Full                                                 +---------+---------------+---------+-----------+----------+-------+ FV DistalFull                                                 +---------+---------------+---------+-----------+----------+-------+ PFV      Full                                                 +---------+---------------+---------+-----------+----------+-------+ POP      Full           Yes      Yes                          +---------+---------------+---------+-----------+----------+-------+ PTV      Full                                                 +---------+---------------+---------+-----------+----------+-------+ PERO     None                                         Acute   +---------+---------------+---------+-----------+----------+-------+    Summary: Right: There is no evidence of deep vein thrombosis in the lower extremity. A cystic structure is found in the popliteal fossa. Left: Findings consistent with acute deep vein thrombosis involving the left peroneal vein. No cystic structure found in the popliteal fossa.  *See table(s) above for measurements and observations. Electronically signed by Gretta Beganodd Early MD on 12/02/2018 at 1:44:42 PM.     Final     EKG     EKG: EKG was personally reviewed and demonstrates: Atrial fibrillation, ventricular rate of 158 bpm, with PVC but no acute ischemic changes.   Telemetry: Telemetry was personally reviewed and demonstrates: Sinus rhythm with heart rates in the 70's to 90's and frequent PVCs.  Cardiac Imaging    Echocardiogram 12/02/2018: Study Conclusions: - Left ventricle: Wall thickness was increased in a pattern of mild   LVH. There was mild focal basal hypertrophy of the septum.   Systolic function was normal. The estimated ejection fraction was   in the range of 55% to 60%. - Aortic valve: Valve area (VTI): 3.76 cm^2. Valve area (Vmean):   3.29 cm^2. - Mitral valve: There was mild regurgitation. - Right ventricle: Severely dilated and hypokinetic. - Right atrium: The atrium was severely dilated. - Atrial septum: No defect or patent foramen ovale was identified. - Pulmonary arteries: PA peak pressure: 42 mm Hg (S).  Assessment & Plan    Atrial Fibrillation with RVR - Initial EKG showed atrial fibrillation with ventricular rate of 158 bpm.  - Patient currently in sinus rhythm with heart rates in the 70's to 90's on telemetry. - Patient's PCP recently ordered Holter Monitor. Results  pending. - Echo showed LVEF of 55-60%, mild LVH, severely dilated and hypokinetic RV, severely dilated RA, and elevated PASP of 42 mmHg but no defect or PFO noted in atrial septum. - Patient started on IV Cardizem in the ED but this was transitioned to PO Cardizem 30mg  every 8 hours this afternoon. Consider consolidating to 120 mg tomorrow.  - Atrial fibrillation may have been triggered by massive PE. - CHADSVASc = 2 (HTN, age). Currently being anticoagulated with IV Heparin. Will need to be transitioned to NOAC prior to discharge. Will defer length of treatment to MD. - Continue to monitor on telemetry.   Elevated Troponin - Troponin elevated at 0.21 upon arrival to the ED.  - Most likely demand  ischemia in the setting of massive PE and atrial fibrillation with RVR. Do not anticipate any further ischemic evaluation at this time. May consider outpatient stress test in several months after treatment for PE. Will discuss with MD.  Pulmonary Emboli and Lower Extremity DVT - Chest CTA showed bilateral occlusive PE involving the proximal pulmonary arteries with evidence of right heart strain (RV/LV Ratio = 1.4) consistent with at least a submassive PE.  - Lower extremity dopplers showed acute DVT involving the left peroneal vein.  - Internal Medicine spoke with Intervention Radiology as well as Critical Care Pulmonary - thrombolytics are not recommended at this time.  - Hypercoagulable work-up pending.  - Of note, patient reports he donates platelets every 3-4 weeks.  - Currently on anticoagulation with IV Heparin. Will need to be transitioned to NOAC prior to discharge. Will defer length of treatment to MD.  Signed, Corrin Parker, PA-C 12/02/2018, 3:13 PM   ATTENDING ATTESTATION  I have seen, examined and evaluated the patient this PM along with Ms. Irene Limbo, PA-C.  After reviewing all the available data and chart, we discussed the patients laboratory, study & physical findings as well as symptoms in detail. I agree with her findings, examination as well as impression recommendations as per our discussion.    Very pleasant gentleman who presents with about a month of worsening exertional dyspnea found to have multiple pulmonary emboli with lower extremity DVT, but his symptoms came to ahead on the day of admission with what probably was A. fib RVR.  Based on his RV dilation on echo, he clearly went into right-sided heart failure and became overly symptomatic.  Thankfully, he spontaneously converted on diltiazem.  He will likely be on anticoagulation for least a year which will give [place] time to determine whether or not he needs to be on anticoagulation therapy beyond that for his A. fib,  perhaps he may not have any recurrence of A. fib since this was related to PEs.  I think the positive troponin is related to massive PE and strain in setting of A. fib RVR.  Would not do an ischemic valuation at this time, but probably would benefit from an ischemic evaluation either a stress test or coronary CTA in the next 6 months or so. He will definitely need echocardiogram follow-up in roughly 6 months to reassess the RV.  Would consolidate diltiazem to XT 120-180 mg daily prior to discharge.   [facility] will sign off.   Medication Recommendations: Would consolidate diltiazem to XT 120-180 mg daily prior to discharge. Other recommendations (labs, testing, etc): Will likely need follow-up echocardiogram in 6 months (can be ordered and post hospital follow-up) Follow up as an outpatient: Roughly 1 month to reassess dyspnea after initial treatment of  PE.   Bryan Lemmaavid , M.D., M.S. Interventional Cardiologist   Pager # [phone] Phone # [phone] [address] [place], [place] [id number]   For questions or updates, please contact    Please consult [email or web address] for contact info under Cardiology/STEMI.

## 2018-12-02 NOTE — Progress Notes (Signed)
Nutrition Brief Note  Patient identified on the Malnutrition Screening Tool (MST) Report  67 y/o male admitted with PE  Met with pt in room today. Pt reports poor appetite and oral intake for 2-3 weeks pta r/t SOB and lethargy. Pt reports his appetite has returned today; pt eating 100% of meals and is eating food brought from outside the hospital. Pt reports his UBW is ~230lbs; pt reports a 6lb(3% wt loss over the past 3 weeks. Pt declines all nutritional supplements and interventions today. Pt reports "I prefer to eat real food" RD educated pt regarding the importance of adequate protein intake needed to preserve lean muscle; pt reports he will order good protein sources with his meals.   Wt Readings from Last 15 Encounters:  12/01/18 101.6 kg    Body mass index is 27.27 kg/m. Patient meets criteria for overweight based on current BMI.   Current diet order is regular, patient is consuming approximately 100% of meals at this time. Labs and medications reviewed.   No nutrition interventions warranted at this time. If nutrition issues arise, please consult RD.   Koleen Distance MS, RD, LDN Pager #- [phone] Office#- [phone] After Hours Pager: [phone]

## 2018-12-02 NOTE — Progress Notes (Addendum)
ANTICOAGULATION CONSULT NOTE   Pharmacy Consult for Heparin Indication: atrial fibrillation  Not on File  Patient Measurements: Height: 6\' 4"  (193 cm) Weight: 224 lb (101.6 kg) IBW/kg (Calculated) : 86.8 Heparin Dosing Weight: 102 kg  Vital Signs: Temp: 98.7 F (37.1 C) (12/23 0739) Temp Source: Oral (12/23 0739) BP: 128/85 (12/23 0739) Pulse Rate: 74 (12/23 0739)  Labs: Recent Labs    12/01/18 1217 12/01/18 2131 12/02/18 0244  HGB 16.5  --  14.0  HCT 49.8  --  42.0  PLT 280  --  257  HEPARINUNFRC  --  0.39 0.30  CREATININE 1.43*  --  1.32*  TROPONINI 0.21*  --   --     Estimated Creatinine Clearance: 68.5 mL/min (A) (by C-G formula based on SCr of 1.32 mg/dL (H)).   Medical History: History reviewed. No pertinent past medical history.  Assessment: 67 y.o. M on heparin for afib with RVR now found to have PE. Heparin level on lower end of therapeutic range at 0.3 at 1600 units/hr. Hgb trending down, plts ok, SCR trending down to 1.32, no bleeding noted.  Goal of Therapy:  Heparin level 0.3-0.7 units/ml Monitor platelets by anticoagulation protocol: Yes   Plan:  Increase heparin gtt to 1700 units/hr Heparin level 6 hours Monitor daily heparin level, CBC, s/sx bleeding  Lenward Chancelloranya Calvina Liptak, PharmD PGY1 Pharmacy Resident Phone [phone] 12/02/2018 7:59 AM

## 2018-12-02 NOTE — Care Management Note (Signed)
Case Management Note  Patient Details  Name: Devona KonigMichael Ono MRN: [id number] Date of Birth: 04-16-1951  Subjective/Objective:    From home with spouse, PE, NCM awaiting benefit check for eliquis/xarelto.                  Action/Plan: NCM will follow for transition of care needs.   Expected Discharge Date:                  Expected Discharge Plan:  Home/Self Care  In-House Referral:     Discharge planning Services  CM Consult, Medication Assistance  Post Acute Care Choice:    Choice offered to:     DME Arranged:    DME Agency:     HH Arranged:    HH Agency:     Status of Service:  In process, will continue to follow  If discussed at Long Length of Stay Meetings, dates discussed:    Additional Comments:  Leone Havenaylor, Saburo Luger Clinton, RN 12/02/2018, 10:45 AM

## 2018-12-02 NOTE — Progress Notes (Signed)
PROGRESS NOTE    Phillip Dalton  NWG:[id number] DOB: March 06, 1951 DOA: 12/01/2018 PCP: No primary care provider on file.   Brief Narrative: 67yom who has been having shortness of breath, DOE for sometime, was seen by pcp and has had workup like labs, ekg holter. He was seen in ER after episode where his dog ran into him and he started getting very short of breath and unable to recover and came to ER where he was found to have PE, ct reported as "1. Bilateral severe occlusive pulmonary emboli involving the proximal pulmonary arteries and a majority of the distal pulmonary arteries. 2. Positive for acute PE with CT evidence of right heart strain (RV/LV Ratio = 1.4) consistent with at least submassive (intermediate risk) PE. The presence of right heart strain has been associated with an increased risk of morbidity and mortality.3. Airspace disease in the posterior RIGHT lower lobe and to lesser degree the lateral RIGHT lower lobe is presumed mild pulmonary infarction." Critical care, IR was contacted on admission, patient was hemodynamically stable and recommended to admit in step down on heparin.  Assessment & Plan:   Bilateral severe occlusive PE involving the right proximal pulmonary arteries and immaturity of the distal pulmonary arteries, with CT evidence of right heart strain and pulm infarct: Patient overall asymptomatic, hemodynamically stable. On admission admitting physician discussed with the critical care and IR Dr Fredia Sorrow and did not recommend thrombolytics/ and was admitted on heparin drip on the  Step down unit. I have ordered echocardiogram. TTE reviewed and is severely dilated and hypokinetic. Patient is doing well on room air when I took him off nasal cannula. He denies any chest pain shortness of breath.Ultrasound showed DVT, but no swelling, calf pain or tenderness in lower extremity. Hypercoagulable work-up in process, but likely secondary to his current flying. . I did discuss  the case with Dr. Bonnielee Haff from IR and have put in official consult and IR will also to monitor.  Paroxysmal atrial fibrillation, new onset: Suspected to be in the setting of PE as above.  Patient is in normal sinus rhythm, start p.o. Cardizem 30 mg q 8hr and stop Cardizem drip. f/u echocardiogram. Check TSH. Cardiology has been consulted.  Mild AKI: Trending down encourage oral hydration. Recent Labs  Lab 12/01/18 1217 12/02/18 0244  BUN 20 23  CREATININE 1.43* 1.32*   DVT prophylaxis:Heparin drip. Code Status:Full code. Family Communication:Wife at the bedside Disposition Plan:On hold SDU.  Consultants: Cardiology  Procedures: None  Antimicrobials: None  Subjective: Seen this morning.  Wife at the bedside. On nasal cannula oxygen, WE took  Oxygen off duringconversation, he denied any shortness of breath chest pain nausea vomiting calf pain  Objective: Vitals:   12/01/18 2002 12/01/18 2319 12/02/18 0341 12/02/18 0739  BP:  129/70 130/85 128/85  Pulse:  81 73 74  Resp:  (!) 23 (!) 21 (!) 23  Temp: 99.6 F (37.6 C) 99.8 F (37.7 C) 98.6 F (37 C) 98.7 F (37.1 C)  TempSrc: Oral Oral Oral Oral  SpO2:  91% 99% 95%  Weight:      Height:       No intake or output data in the 24 hours ending 12/02/18 0910 Filed Weights   12/01/18 1205  Weight: 101.6 kg   Weight change:   Body mass index is 27.27 kg/m.  Examination:  General exam: Calm, comfortable, not in acute distress, average built. On RA saturating 95%. HEENT:Oral mucosa moist, Ear/Nose WNL grossly Respiratory system: Bilateral equal  air entry, no crackles and wheezing, no use of accessory muscles. Cardiovascular system: S1 & S2 heard, No JVD/murmurs. Gastrointestinal system: Abdomen soft, nontender non-distended, BS +. Nervous System:Alert/awake/oriented at baseline.  Intact sensation, reflexes intact, power 5/5 UE/LE Extremities:No edema,distal peripheral pulses palpable. Skin:No rashes,no icterus. ZOX:WRUEAVSK:Normal  muscle bulk,tone ,power  Data Reviewed:I have personally reviewed following labs and imaging studies  CBC: Recent Labs  Lab 12/01/18 1217 12/02/18 0244  WBC 13.7* 11.0*  HGB 16.5 14.0  HCT 49.8 42.0  MCV 85.6 84.0  PLT 280 257   Basic Metabolic Panel: Recent Labs  Lab 12/01/18 1217 12/02/18 0244  NA 139 138  K 3.9 4.4  CL 102 107  CO2 21* 24  GLUCOSE 144* 124*  BUN 20 23  CREATININE 1.43* 1.32*  CALCIUM 9.6 8.7*  MG 2.2  --    GFR: Estimated Creatinine Clearance: 68.5 mL/min (A) (by C-G formula based on SCr of 1.32 mg/dL (H)). Liver Function Tests: Recent Labs  Lab 12/01/18 1217  AST 55*  ALT 27  ALKPHOS 58  BILITOT 1.2  PROT 7.7  ALBUMIN 3.7   No results for input(s): LIPASE, AMYLASE in the last 168 hours. No results for input(s): AMMONIA in the last 168 hours. Coagulation Profile: No results for input(s): INR, PROTIME in the last 168 hours. Cardiac Enzymes: Recent Labs  Lab 12/01/18 1217  TROPONINI 0.21*   BNP (last 3 results) No results for input(s): PROBNP in the last 8760 hours. HbA1C: No results for input(s): HGBA1C in the last 72 hours. CBG: No results for input(s): GLUCAP in the last 168 hours. Lipid Profile: No results for input(s): CHOL, HDL, LDLCALC, TRIG, CHOLHDL, LDLDIRECT in the last 72 hours. Thyroid Function Tests: No results for input(s): TSH, T4TOTAL, FREET4, T3FREE, THYROIDAB in the last 72 hours. Anemia Panel: No results for input(s): VITAMINB12, FOLATE, FERRITIN, TIBC, IRON, RETICCTPCT in the last 72 hours. Sepsis Labs: No results for input(s): PROCALCITON, LATICACIDVEN in the last 168 hours.  Recent Results (from the past 240 hour(s))  MRSA PCR Screening     Status: None   Collection Time: 12/01/18  9:08 PM  Result Value Ref Range Status   MRSA by PCR NEGATIVE NEGATIVE Final    Comment:        The GeneXpert MRSA Assay (FDA approved for NASAL specimens only), is one component of a comprehensive MRSA  colonization surveillance program. It is not intended to diagnose MRSA infection nor to guide or monitor treatment for MRSA infections. Performed at [facility], [address] [place], [place] [address]       Radiology Studies: Ct Angio Chest Pe W And/or Wo Contrast  Result Date: 12/01/2018 CLINICAL DATA:  Short of breath.  Acute onset. EXAM: CT ANGIOGRAPHY CHEST WITH CONTRAST TECHNIQUE: Multidetector CT imaging of the chest was performed using the standard protocol during bolus administration of intravenous contrast. Multiplanar CT image reconstructions and MIPs were obtained to evaluate the vascular anatomy. CONTRAST:  75mL ISOVUE-370 IOPAMIDOL (ISOVUE-370) INJECTION 76% COMPARISON:  None. FINDINGS: Cardiovascular: There are large filling defects within the distal LEFT and RIGHT pulmonary arteries which are occlusive. Emboli extend into the lower lobe pulmonary arteries and upper lobe pulmonary arteries. Emboli extend into the RIGHT middle lobe and lingula. Overall clot burden is severe. There is evidence of RIGHT ventricular strain with the ratio of the RIGHT ventricular diameter ratio to LEFT ventricular diameter greater than 1 (RV/LV equal 1.44). There is flattening of the intraventricular septum along the RIGHT  ventricular interface. Mediastinum/Nodes: No axillary supraclavicular adenopathy. No mediastinal hilar adenopathy. No pericardial effusion. Lungs/Pleura: There is fluffy airspace disease in the posterior segment of the RIGHT lower lobe. Subtle peripheral airspace disease in the lateral segment of the RIGHT lower lobe (image 59/7). Calcified nodule in the LEFT upper lobe is small. Upper Abdomen: Limited view of the liver, kidneys, pancreas are unremarkable. Normal adrenal glands. Musculoskeletal: No aggressive osseous lesion. Review of the MIP images confirms the above findings. IMPRESSION: 1. Bilateral severe occlusive pulmonary emboli involving the proximal pulmonary arteries  and a majority of the distal pulmonary arteries. 2. Positive for acute PE with CT evidence of right heart strain (RV/LV Ratio = 1.4) consistent with at least submassive (intermediate risk) PE. The presence of right heart strain has been associated with an increased risk of morbidity and mortality. Please activate Code PE by paging [phone]. 3. Airspace disease in the posterior RIGHT lower lobe and to lesser degree the lateral RIGHT lower lobe is presumed mild pulmonary infarction. Critical Value/emergent results were called by telephone at the time of interpretation on 12/01/2018 at 2:43 pm to Dr. Virgina NorfolkAdam Curatolo, who verbally acknowledged these results. Electronically Signed   By: Genevive BiStewart  Edmunds M.D.   On: 12/01/2018 14:46   Dg Chest Port 1 View  Result Date: 12/01/2018 CLINICAL DATA:  Shortness of breath. EXAM: PORTABLE CHEST 1 VIEW COMPARISON:  None. FINDINGS: Lungs are adequately inflated with mild hazy density over the right upper lobe/apex which may be true parenchymal process versus overlapping bony and vascular structures. No effusion. Cardiomediastinal silhouette and remainder the exam is unremarkable. IMPRESSION: Mild hazy density over the right upper lobe/apex which may be true parenchymal process versus overlapping bony and vascular structures. Consider PA and lateral chest radiograph for further evaluation. Electronically Signed   By: Elberta Fortisaniel  Boyle M.D.   On: 12/01/2018 13:02   Scheduled Meds: Continuous Infusions: . diltiazem (CARDIZEM) infusion 10 mg/hr (12/01/18 2248)  . heparin 1,700 Units/hr (12/02/18 0829)     LOS: 1 day   Time spent: More than 50% of that time was spent in counseling and/or coordination of care. Lanae Boastamesh Logan [place], MD [facility] Pager [phone]  If 7PM-7AM, please contact night-coverage [email or web address] Password [email or web address] 12/02/2018, 9:10 AM

## 2018-12-02 NOTE — Progress Notes (Signed)
ANTICOAGULATION CONSULT NOTE   Pharmacy Consult for Heparin Indication: atrial fibrillation  Not on File  Patient Measurements: Height: 6\' 4"  (193 cm) Weight: 224 lb (101.6 kg) IBW/kg (Calculated) : 86.8 Heparin Dosing Weight: 102 kg  Vital Signs: Temp: 97.9 F (36.6 C) (12/23 1149) Temp Source: Oral (12/23 1149) BP: 138/99 (12/23 1405) Pulse Rate: 78 (12/23 1149)  Labs: Recent Labs    12/01/18 1217 12/01/18 2131 12/02/18 0244 12/02/18 1519  HGB 16.5  --  14.0  --   HCT 49.8  --  42.0  --   PLT 280  --  257  --   HEPARINUNFRC  --  0.39 0.30 0.30  CREATININE 1.43*  --  1.32*  --   TROPONINI 0.21*  --   --   --     Estimated Creatinine Clearance: 68.5 mL/min (A) (by C-G formula based on SCr of 1.32 mg/dL (H)).   Medical History: History reviewed. No pertinent past medical history.  Assessment: 67 y.o. M on heparin for afib with RVR now found to have PE. Heparin level remains on lower end of therapeutic range at 0.3 at 1700 units/hr. Hgb trending down, plts ok, SCR trending down to 1.32, no bleeding noted.  Goal of Therapy:  Heparin level 0.3-0.7 units/ml Monitor platelets by anticoagulation protocol: Yes   Plan:  Increase heparin gtt to 1800 units/hr Monitor daily heparin level, CBC, s/sx bleeding  Lenward Chancelloranya Makhlouf, PharmD PGY1 Pharmacy Resident Phone [phone] 12/02/2018 4:05 PM

## 2018-12-03 ENCOUNTER — Ambulatory Visit: Payer: BLUE CROSS/BLUE SHIELD | Admitting: Cardiology

## 2018-12-03 ENCOUNTER — Inpatient Hospital Stay (HOSPITAL_COMMUNITY): Payer: BLUE CROSS/BLUE SHIELD

## 2018-12-03 DIAGNOSIS — I2609 Other pulmonary embolism with acute cor pulmonale: Secondary | ICD-10-CM | POA: Diagnosis not present

## 2018-12-03 DIAGNOSIS — I2694 Multiple subsegmental pulmonary emboli without acute cor pulmonale: Secondary | ICD-10-CM | POA: Diagnosis not present

## 2018-12-03 DIAGNOSIS — I2699 Other pulmonary embolism without acute cor pulmonale: Secondary | ICD-10-CM | POA: Diagnosis not present

## 2018-12-03 LAB — URINALYSIS, ROUTINE W REFLEX MICROSCOPIC
Bacteria, UA: NONE SEEN
Bilirubin Urine: NEGATIVE
Glucose, UA: NEGATIVE mg/dL
Ketones, ur: 5 mg/dL — AB
Leukocytes, UA: NEGATIVE
Nitrite: NEGATIVE
Protein, ur: NEGATIVE mg/dL
Specific Gravity, Urine: 1.013 (ref 1.005–1.030)
pH: 6 (ref 5.0–8.0)

## 2018-12-03 LAB — CBC
HCT: 41.2 % (ref 39.0–52.0)
Hemoglobin: 13.8 g/dL (ref 13.0–17.0)
MCH: 27.9 pg (ref 26.0–34.0)
MCHC: 33.5 g/dL (ref 30.0–36.0)
MCV: 83.2 fL (ref 80.0–100.0)
Platelets: 278 10*3/uL (ref 150–400)
RBC: 4.95 MIL/uL (ref 4.22–5.81)
RDW: 13 % (ref 11.5–15.5)
WBC: 10.8 10*3/uL — ABNORMAL HIGH (ref 4.0–10.5)
nRBC: 0 % (ref 0.0–0.2)

## 2018-12-03 LAB — BASIC METABOLIC PANEL
Anion gap: 11 (ref 5–15)
BUN: 19 mg/dL (ref 8–23)
CO2: 22 mmol/L (ref 22–32)
Calcium: 8.4 mg/dL — ABNORMAL LOW (ref 8.9–10.3)
Chloride: 105 mmol/L (ref 98–111)
Creatinine, Ser: 1.25 mg/dL — ABNORMAL HIGH (ref 0.61–1.24)
GFR calc Af Amer: >60 mL/min (ref >60)
GFR calc non Af Amer: >60 mL/min (ref >60)
Glucose, Bld: 115 mg/dL — ABNORMAL HIGH (ref 70–99)
Potassium: 4.2 mmol/L (ref 3.5–5.1)
Sodium: 138 mmol/L (ref 135–145)

## 2018-12-03 LAB — INFLUENZA PANEL BY PCR (TYPE A & B)
Influenza A By PCR: NEGATIVE
Influenza B By PCR: NEGATIVE

## 2018-12-03 LAB — HEPARIN LEVEL (UNFRACTIONATED)
Heparin Unfractionated: 0.28 IU/mL — ABNORMAL LOW (ref 0.30–0.70)
Heparin Unfractionated: 0.32 IU/mL (ref 0.30–0.70)

## 2018-12-03 LAB — TSH: TSH: 2.446 u[IU]/mL (ref 0.350–4.500)

## 2018-12-03 NOTE — Progress Notes (Signed)
12/03/2018 Patient having fever today  Of 102.3 patient was given tylenol 650 mg temp was reassess temp was 102.6. Dr Dayna Barkeramesh was made aware.Lovie MacadamiaNadine Alexandr Oehler RN

## 2018-12-03 NOTE — Progress Notes (Signed)
PROGRESS NOTE    Phillip KonigMichael Stirling  ZOX:[id number] DOB: [birth year] DOA: 12/01/2018 PCP: No primary care provider on file.   Brief Narrative: 67yom who has been having shortness of breath, DOE for sometime, was seen by pcp and has had workup like labs, ekg holter. He was seen in ER after episode where his dog ran into him and he started getting very short of breath and unable to recover and came to ER where he was found to have PE, ct reported as "1. Bilateral severe occlusive pulmonary emboli involving the proximal pulmonary arteries and a majority of the distal pulmonary arteries. 2. Positive for acute PE with CT evidence of right heart strain (RV/LV Ratio = 1.4) consistent with at least submassive (intermediate risk) PE. The presence of right heart strain has been associated with an increased risk of morbidity and mortality.3. Airspace disease in the posterior RIGHT lower lobe and to lesser degree the lateral RIGHT lower lobe is presumed mild pulmonary infarction." Critical care, IR was contacted on admission, patient was hemodynamically stable and recommended to admit in step down on heparin.  Assessment & Plan:   Bilateral severe occlusive PE involving the right proximal pulmonary arteries and immaturity of the distal pulmonary arteries, with CT evidence of right heart strain and pulm infarct: Patient remains on room air, does have dyspnea with activity, vitals stable. On admission admitting physician discussed with the critical care and IR Dr Fredia SorrowYamagata and did not recommend thrombolytics/ and was admitted on heparin drip on the  SDU. Echocardiogram showed severely dilated and hypokinetic. Hypercoagulable work-up in process, but suspect PTE was associated due to his flights. I had discussed the case with Dr. Bonnielee HaffHoss from IR.  Continue on heparin additional 24 hours and if patient remains stable code start on Eliquis load w 10 mg BID. I discussed with him Coumadin versus NOAC  risks/benefits/indications, including that NOAC may not have a readily available reversal agent however they are short-acting compared to Coumadin patient verbalized understanding.  Acute DVT of the left peroneal vein:Continue on heparin drip as above.   Episodes of fever this afternoon: Check influenza screen, resp viral panel /UA/blood culture.  Could be related to the blood clot/pulmonary infarction.  Chest x-ray obtained and no significant change.  Paroxysmal atrial fibrillation, new onset: Suspected to be in the setting of PE as above.in NSR now. He is on cardizem 30 mg q 8hr and if Bp remains stable can consider switching to cardizemd  120 mg prior to discharge. Nl TSH. appreciate cardiology inputs.    Mild AKI vs CKD:  Creat at 1.2, trending down w ivf, likely AKI  Recent Labs  Lab 12/01/18 1217 12/02/18 0244 12/03/18 0252  BUN 20 23 19   CREATININE 1.43* 1.32* 1.25*   DVT prophylaxis:Heparin drip. Code Status:Full code. Family Communication:Wife at the bedside Disposition Plan:On hold SDU.  Consultants: Cardiology  Procedures: None  Antimicrobials: None  Subjective: Patient seen this morning.  Reports some pleuritic chest pain intermittently and shortness of breath especially with ambulation.  Otherwise no nausea vomiting.  During the day patient started having fever. He denies any bleeding hematuria/hematochezia, focal weakness headache. Objective: Vitals:   12/03/18 0804 12/03/18 1147 12/03/18 1340 12/03/18 1552  BP: (!) 133/91 (!) 153/102    Pulse: 77 98 (!) 101 87  Resp: 18 (!) 28 20 (!) 33  Temp: 98.7 F (37.1 C) (!) 100.4 F (38 C) (!) 102.3 F (39.1 C) (!) 102.6 F (39.2 C)  TempSrc: Oral Oral Oral Oral  SpO2: 94% 93% 93% 91%  Weight:      Height:        Intake/Output Summary (Last 24 hours) at 12/03/2018 1559 Last data filed at 12/03/2018 0601 Gross per 24 hour  Intake 491.85 ml  Output 1975 ml  Net -1483.15 ml   Filed Weights   12/01/18 1205    Weight: 101.6 kg   Weight change:   Body mass index is 27.27 kg/m.  Examination:  General exam: Calm, comfortable, not in acute distress,average built.  HEENT:Oral mucosa moist, Ear/Nose WNL grossly, dentition normal. Respiratory system: Bilateral equal air entry, no crackles and wheezing, no use of accessory muscle. Cardiovascular system: regular rate and rhythm, S1 & S2 heard, No JVD/murmurs.No pedal edema. Gastrointestinal system: Abdomen soft, nontender non-distended, BS +. No hepatosplenomegaly palpable. Nervous System:Alert, awake and oriented at baseline.Able to move UE and LE, sensation intact. Extremities: No edema, distal peripheral pulses palpable.  Skin: No rashes,no icterus. MSK: Normal muscle bulk,tone ,power  Data Reviewed:I have personally reviewed following labs and imaging studies  CBC: Recent Labs  Lab 12/01/18 1217 12/02/18 0244 12/03/18 0252  WBC 13.7* 11.0* 10.8*  HGB 16.5 14.0 13.8  HCT 49.8 42.0 41.2  MCV 85.6 84.0 83.2  PLT 280 257 278   Basic Metabolic Panel: Recent Labs  Lab 12/01/18 1217 12/02/18 0244 12/03/18 0252  NA 139 138 138  K 3.9 4.4 4.2  CL 102 107 105  CO2 21* 24 22  GLUCOSE 144* 124* 115*  BUN 20 23 19   CREATININE 1.43* 1.32* 1.25*  CALCIUM 9.6 8.7* 8.4*  MG 2.2  --   --    GFR: Estimated Creatinine Clearance: 72.3 mL/min (A) (by C-G formula based on SCr of 1.25 mg/dL (H)). Liver Function Tests: Recent Labs  Lab 12/01/18 1217  AST 55*  ALT 27  ALKPHOS 58  BILITOT 1.2  PROT 7.7  ALBUMIN 3.7   No results for input(s): LIPASE, AMYLASE in the last 168 hours. No results for input(s): AMMONIA in the last 168 hours. Coagulation Profile: No results for input(s): INR, PROTIME in the last 168 hours. Cardiac Enzymes: Recent Labs  Lab 12/01/18 1217  TROPONINI 0.21*   BNP (last 3 results) No results for input(s): PROBNP in the last 8760 hours. HbA1C: No results for input(s): HGBA1C in the last 72 hours. CBG: No  results for input(s): GLUCAP in the last 168 hours. Lipid Profile: No results for input(s): CHOL, HDL, LDLCALC, TRIG, CHOLHDL, LDLDIRECT in the last 72 hours. Thyroid Function Tests: Recent Labs    12/03/18 0252  TSH 2.446   Anemia Panel: No results for input(s): VITAMINB12, FOLATE, FERRITIN, TIBC, IRON, RETICCTPCT in the last 72 hours. Sepsis Labs: No results for input(s): PROCALCITON, LATICACIDVEN in the last 168 hours.  Recent Results (from the past 240 hour(s))  MRSA PCR Screening     Status: None   Collection Time: 12/01/18  9:08 PM  Result Value Ref Range Status   MRSA by PCR NEGATIVE NEGATIVE Final    Comment:        The GeneXpert MRSA Assay (FDA approved for NASAL specimens only), is one component of a comprehensive MRSA colonization surveillance program. It is not intended to diagnose MRSA infection nor to guide or monitor treatment for MRSA infections. Performed at [facility], [address] [place], [place] [address]       Radiology Studies: Dg Chest Port 1 View  Result Date: 12/03/2018 CLINICAL DATA:  Pulmonary emboli. EXAM: PORTABLE CHEST  1 VIEW COMPARISON:  12/01/2018 FINDINGS: Hazy right upper lobe airspace opacity persists. Interstitial markings are diffusely coarsened with chronic features. Tiny nodule left mid lung, likely granuloma. Stable appearance of hilar prominence. The visualized bony structures of the thorax are intact. Telemetry leads overlie the chest. IMPRESSION: Stable exam. Electronically Signed   By: Kennith Center M.D.   On: 12/03/2018 14:15   Vas [place] Lower Extremity Venous (dvt)  Result Date: 12/02/2018  Lower Venous Study Risk Factors: Confirmed PE. Performing Technologist: Jeb Levering RDMS, RVT  Examination Guidelines: A complete evaluation includes B-mode imaging, spectral Doppler, color Doppler, and power Doppler as needed of all accessible portions of each vessel. Bilateral testing is considered an integral part of a complete  examination. Limited examinations for reoccurring indications may be performed as noted.  Right Venous Findings: +---------+---------------+---------+-----------+----------+-------+          CompressibilityPhasicitySpontaneityPropertiesSummary +---------+---------------+---------+-----------+----------+-------+ CFV      Full           Yes      Yes                          +---------+---------------+---------+-----------+----------+-------+ SFJ      Full                                                 +---------+---------------+---------+-----------+----------+-------+ FV Prox  Full                                                 +---------+---------------+---------+-----------+----------+-------+ FV Mid   Full                                                 +---------+---------------+---------+-----------+----------+-------+ FV DistalFull                                                 +---------+---------------+---------+-----------+----------+-------+ PFV      Full                                                 +---------+---------------+---------+-----------+----------+-------+ POP      Full           Yes      Yes                          +---------+---------------+---------+-----------+----------+-------+ PTV      Full                                                 +---------+---------------+---------+-----------+----------+-------+ PERO     Full                                                 +---------+---------------+---------+-----------+----------+-------+  Left Venous Findings: +---------+---------------+---------+-----------+----------+-------+          CompressibilityPhasicitySpontaneityPropertiesSummary +---------+---------------+---------+-----------+----------+-------+ CFV      Full           Yes      Yes                          +---------+---------------+---------+-----------+----------+-------+ SFJ      Full                                                  +---------+---------------+---------+-----------+----------+-------+ FV Prox  Full                                                 +---------+---------------+---------+-----------+----------+-------+ FV Mid   Full                                                 +---------+---------------+---------+-----------+----------+-------+ FV DistalFull                                                 +---------+---------------+---------+-----------+----------+-------+ PFV      Full                                                 +---------+---------------+---------+-----------+----------+-------+ POP      Full           Yes      Yes                          +---------+---------------+---------+-----------+----------+-------+ PTV      Full                                                 +---------+---------------+---------+-----------+----------+-------+ PERO     None                                         Acute   +---------+---------------+---------+-----------+----------+-------+    Summary: Right: There is no evidence of deep vein thrombosis in the lower extremity. A cystic structure is found in the popliteal fossa. Left: Findings consistent with acute deep vein thrombosis involving the left peroneal vein. No cystic structure found in the popliteal fossa.  *See table(s) above for measurements and observations. Electronically signed by Gretta Began MD on 12/02/2018 at 1:44:42 PM.    Final    Scheduled Meds: . diltiazem  30 mg Oral Q8H   Continuous Infusions: . sodium chloride 75 mL/hr at 12/03/18 0816  . heparin 1,900 Units/hr (12/03/18 1044)     LOS: 2 days  Time spent: More than 50% of that time was spent in counseling and/or coordination of care. Lanae Boast, MD [facility] Pager [phone]  If 7PM-7AM, please contact night-coverage [email or web address] Password [facility] 12/03/2018, 3:59 PM

## 2018-12-03 NOTE — Progress Notes (Signed)
ANTICOAGULATION CONSULT NOTE   Pharmacy Consult for Heparin Indication: atrial fibrillation  Patient Measurements: Height: 6\' 4"  (193 cm) Weight: 224 lb (101.6 kg) IBW/kg (Calculated) : 86.8 HEPARIN DW (KG): 101.6  Vital Signs: Temp: 98.7 F (37.1 C) (12/24 0804) Temp Source: Oral (12/24 0804) BP: 133/91 (12/24 0804) Pulse Rate: 77 (12/24 0804)  Labs: Recent Labs    12/01/18 1217  12/02/18 0244 12/02/18 1519 12/03/18 0252  HGB 16.5  --  14.0  --  13.8  HCT 49.8  --  42.0  --  41.2  PLT 280  --  257  --  278  HEPARINUNFRC  --    < > 0.30 0.30 0.28*  CREATININE 1.43*  --  1.32*  --  1.25*  TROPONINI 0.21*  --   --   --   --    < > = values in this interval not displayed.    Estimated Creatinine Clearance: 72.3 mL/min (A) (by C-G formula based on SCr of 1.25 mg/dL (H)).   Medical History: History reviewed. No pertinent past medical history.  Assessment: 67 y.o. M with no known history of heart disease presenting with atrial fibrillation with RVR now found to have severe occlusive bilateral pulmonary emboli. Pharmacy has been consulted to dose heparin.  Heparin level subtherapeutic this morning at 0.28 after dose increase yesterday. CBC low stable, no issues with the infusion overnight, and no documented bleeding.    Goal of Therapy:  Heparin level 0.3-0.7 units/ml Monitor platelets by anticoagulation protocol: Yes   Plan:  Increase heparin gtt to 1900 units/hr Check heparin level in 6 hours Monitor daily heparin level, CBC, s/sx bleeding F/u plans to transition to PO anticoagulant   Harlow MaresAmy Emmogene Simson, PharmD PGY1 Pharmacy Resident Phone [phone]  12/03/2018   9:08 AM

## 2018-12-03 NOTE — Progress Notes (Signed)
Referring Physician(s): Dr Kirby Crigler Kc  Supervising Physician: Ruel FavorsShick, Trevor  [facility]  [facility]  Chief Complaint:  Short of breath-- resolving  Subjective:  Pt was seen and evaluated 12/22 in ED Admitted to floor with SOB IR was called to consider PE thrombolysis Dr Fredia SorrowYamagata discussed with MD that day  Pt with CTA +PE and Rt heart strain Doppler: Summary: Right: There is no evidence of deep vein thrombosis in the lower extremity. A cystic structure is found in the popliteal fossa. Left: Findings consistent with acute deep vein thrombosis involving the left peroneal vein. No cystic structure found in the popliteal fossa.  Pt was in NAD Breathing easier  Was determined NOT a candidate for PE Thrombolysis on 12/22  Today pt is up in bed Heparin IV ongoing Feels well Denies SOB   Allergies: Patient has no allergy information on record.  Medications: Prior to Admission medications   Medication Sig Start Date End Date Taking? Authorizing Provider  naproxen sodium (ALEVE) 220 MG tablet Take 440 mg by mouth as needed (pain).   Yes [provider]     Vital Signs: BP (!) 133/91 (BP Location: Right Arm)   Pulse 77   Temp 98.7 F (37.1 C) (Oral)   Resp 18   Ht 6\' 4"  (1.93 m)   Wt 224 lb (101.6 kg)   SpO2 94%   BMI 27.27 kg/m   Physical Exam Vitals signs reviewed.  Cardiovascular:     Rate and Rhythm: Normal rate and regular rhythm.  Pulmonary:     Effort: Pulmonary effort is normal.     Breath sounds: Normal breath sounds.  Abdominal:     General: Bowel sounds are normal.     Palpations: Abdomen is soft.  Musculoskeletal: Normal range of motion.  Skin:    General: Skin is warm and dry.  Neurological:     General: No focal deficit present.     Mental Status: He is oriented to person, place, and time.  Psychiatric:        Mood and Affect: Mood normal.        Behavior: Behavior normal.     Imaging: Ct Angio Chest Pe W And/or Wo  Contrast  Result Date: 12/01/2018 CLINICAL DATA:  Short of breath.  Acute onset. EXAM: CT ANGIOGRAPHY CHEST WITH CONTRAST TECHNIQUE: Multidetector CT imaging of the chest was performed using the standard protocol during bolus administration of intravenous contrast. Multiplanar CT image reconstructions and MIPs were obtained to evaluate the vascular anatomy. CONTRAST:  75mL ISOVUE-370 IOPAMIDOL (ISOVUE-370) INJECTION 76% COMPARISON:  None. FINDINGS: Cardiovascular: There are large filling defects within the distal LEFT and RIGHT pulmonary arteries which are occlusive. Emboli extend into the lower lobe pulmonary arteries and upper lobe pulmonary arteries. Emboli extend into the RIGHT middle lobe and lingula. Overall clot burden is severe. There is evidence of RIGHT ventricular strain with the ratio of the RIGHT ventricular diameter ratio to LEFT ventricular diameter greater than 1 (RV/LV equal 1.44). There is flattening of the intraventricular septum along the RIGHT ventricular interface. Mediastinum/Nodes: No axillary supraclavicular adenopathy. No mediastinal hilar adenopathy. No pericardial effusion. Lungs/Pleura: There is fluffy airspace disease in the posterior segment of the RIGHT lower lobe. Subtle peripheral airspace disease in the lateral segment of the RIGHT lower lobe (image 59/7). Calcified nodule in the LEFT upper lobe is small. Upper Abdomen: Limited view of the liver, kidneys, pancreas are unremarkable. Normal adrenal glands. Musculoskeletal: No aggressive osseous lesion. Review of  the MIP images confirms the above findings. IMPRESSION: 1. Bilateral severe occlusive pulmonary emboli involving the proximal pulmonary arteries and a majority of the distal pulmonary arteries. 2. Positive for acute PE with CT evidence of right heart strain (RV/LV Ratio = 1.4) consistent with at least submassive (intermediate risk) PE. The presence of right heart strain has been associated with an increased risk of  morbidity and mortality. Please activate Code PE by paging [phone]. 3. Airspace disease in the posterior RIGHT lower lobe and to lesser degree the lateral RIGHT lower lobe is presumed mild pulmonary infarction. Critical Value/emergent results were called by telephone at the time of interpretation on 12/01/2018 at 2:43 pm to Dr. Virgina Norfolk, who verbally acknowledged these results. Electronically Signed   By: Genevive Bi M.D.   On: 12/01/2018 14:46   Dg Chest Port 1 View  Result Date: 12/01/2018 CLINICAL DATA:  Shortness of breath. EXAM: PORTABLE CHEST 1 VIEW COMPARISON:  None. FINDINGS: Lungs are adequately inflated with mild hazy density over the right upper lobe/apex which may be true parenchymal process versus overlapping bony and vascular structures. No effusion. Cardiomediastinal silhouette and remainder the exam is unremarkable. IMPRESSION: Mild hazy density over the right upper lobe/apex which may be true parenchymal process versus overlapping bony and vascular structures. Consider PA and lateral chest radiograph for further evaluation. Electronically Signed   By: Elberta Fortis M.D.   On: 12/01/2018 13:02   Vas [place] Lower Extremity Venous (dvt)  Result Date: 12/02/2018  Lower Venous Study Risk Factors: Confirmed PE. Performing Technologist: Jeb Levering RDMS, RVT  Examination Guidelines: A complete evaluation includes B-mode imaging, spectral Doppler, color Doppler, and power Doppler as needed of all accessible portions of each vessel. Bilateral testing is considered an integral part of a complete examination. Limited examinations for reoccurring indications may be performed as noted.  Right Venous Findings: +---------+---------------+---------+-----------+----------+-------+          CompressibilityPhasicitySpontaneityPropertiesSummary +---------+---------------+---------+-----------+----------+-------+ CFV      Full           Yes      Yes                           +---------+---------------+---------+-----------+----------+-------+ SFJ      Full                                                 +---------+---------------+---------+-----------+----------+-------+ FV Prox  Full                                                 +---------+---------------+---------+-----------+----------+-------+ FV Mid   Full                                                 +---------+---------------+---------+-----------+----------+-------+ FV DistalFull                                                 +---------+---------------+---------+-----------+----------+-------+ PFV  Full                                                 +---------+---------------+---------+-----------+----------+-------+ POP      Full           Yes      Yes                          +---------+---------------+---------+-----------+----------+-------+ PTV      Full                                                 +---------+---------------+---------+-----------+----------+-------+ PERO     Full                                                 +---------+---------------+---------+-----------+----------+-------+  Left Venous Findings: +---------+---------------+---------+-----------+----------+-------+          CompressibilityPhasicitySpontaneityPropertiesSummary +---------+---------------+---------+-----------+----------+-------+ CFV      Full           Yes      Yes                          +---------+---------------+---------+-----------+----------+-------+ SFJ      Full                                                 +---------+---------------+---------+-----------+----------+-------+ FV Prox  Full                                                 +---------+---------------+---------+-----------+----------+-------+ FV Mid   Full                                                  +---------+---------------+---------+-----------+----------+-------+ FV DistalFull                                                 +---------+---------------+---------+-----------+----------+-------+ PFV      Full                                                 +---------+---------------+---------+-----------+----------+-------+ POP      Full           Yes      Yes                          +---------+---------------+---------+-----------+----------+-------+ PTV  Full                                                 +---------+---------------+---------+-----------+----------+-------+ PERO     None                                         Acute   +---------+---------------+---------+-----------+----------+-------+    Summary: Right: There is no evidence of deep vein thrombosis in the lower extremity. A cystic structure is found in the popliteal fossa. Left: Findings consistent with acute deep vein thrombosis involving the left peroneal vein. No cystic structure found in the popliteal fossa.  *See table(s) above for measurements and observations. Electronically signed by Gretta Beganodd Early MD on 12/02/2018 at 1:44:42 PM.    Final     Labs:  CBC: Recent Labs    12/01/18 1217 12/02/18 0244 12/03/18 0252  WBC 13.7* 11.0* 10.8*  HGB 16.5 14.0 13.8  HCT 49.8 42.0 41.2  PLT 280 257 278    COAGS: No results for input(s): INR, APTT in the last 8760 hours.  BMP: Recent Labs    12/01/18 1217 12/02/18 0244 12/03/18 0252  NA 139 138 138  K 3.9 4.4 4.2  CL 102 107 105  CO2 21* 24 22  GLUCOSE 144* 124* 115*  BUN 20 23 19   CALCIUM 9.6 8.7* 8.4*  CREATININE 1.43* 1.32* 1.25*  GFRNONAA 51* 56* >60  GFRAA 59* >60 >60    LIVER FUNCTION TESTS: Recent Labs    12/01/18 1217  BILITOT 1.2  AST 55*  ALT 27  ALKPHOS 58  PROT 7.7  ALBUMIN 3.7    Assessment and Plan:  +PE SOB less heparin drip ongoing PE thrombolysis not indicated in this pt at this time If  symptoms change or worsen--- could reconsider then  Electronically Signed: Robet LeuPamela A Cliffie Gingras, PA-C 12/03/2018, 11:08 AM   I spent a total of 35 Minutes at the the patient's bedside AND on the patient's hospital floor or unit, greater than 50% of which was counseling/coordinating care for consideration of PE Lysis

## 2018-12-03 NOTE — Progress Notes (Signed)
12/03/2018 Temp was reassess at 1819 it was 99.9 orally. Lovie MacadamiaNadine August Longest RN

## 2018-12-03 NOTE — Progress Notes (Signed)
ANTICOAGULATION CONSULT NOTE   Pharmacy Consult for Heparin Indication: atrial fibrillation  Patient Measurements: Height: 6\' 4"  (193 cm) Weight: 224 lb (101.6 kg) IBW/kg (Calculated) : 86.8 HEPARIN DW (KG): 101.6  Vital Signs: Temp: 101.3 F (38.5 C) (12/24 1610) Temp Source: Oral (12/24 1610) BP: 151/90 (12/24 1610) Pulse Rate: 91 (12/24 1610)  Labs: Recent Labs    12/01/18 1217  12/02/18 0244 12/02/18 1519 12/03/18 0252 12/03/18 1634  HGB 16.5  --  14.0  --  13.8  --   HCT 49.8  --  42.0  --  41.2  --   PLT 280  --  257  --  278  --   HEPARINUNFRC  --    < > 0.30 0.30 0.28* 0.32  CREATININE 1.43*  --  1.32*  --  1.25*  --   TROPONINI 0.21*  --   --   --   --   --    < > = values in this interval not displayed.    Estimated Creatinine Clearance: 72.3 mL/min (A) (by C-G formula based on SCr of 1.25 mg/dL (H)).   Medical History: History reviewed. No pertinent past medical history.  Assessment: 67 y.o. M with no known history of heart disease presenting with atrial fibrillation with RVR now found to have severe occlusive bilateral pulmonary emboli. Pharmacy has been consulted to dose heparin.  Heparin level therapeutic at 0.32 units/mL  Goal of Therapy:  Heparin level 0.3-0.7 units/ml Monitor platelets by anticoagulation protocol: Yes   Plan:  Continue heparin gtt at 1900 units/hr Monitor daily heparin level, CBC, s/sx bleeding F/u plans to transition to PO anticoagulant  Ewing Schleinolton Alayne Estrella, PharmD PGY1 Pharmacy Resident 12/03/2018    5:36 PM Please check AMION for all [facility] Pharmacy numbers

## 2018-12-04 DIAGNOSIS — I2694 Multiple subsegmental pulmonary emboli without acute cor pulmonale: Secondary | ICD-10-CM | POA: Diagnosis not present

## 2018-12-04 DIAGNOSIS — I2609 Other pulmonary embolism with acute cor pulmonale: Secondary | ICD-10-CM | POA: Diagnosis not present

## 2018-12-04 LAB — RESPIRATORY PANEL BY PCR

## 2018-12-04 LAB — BASIC METABOLIC PANEL WITH GFR
Anion gap: 12 (ref 5–15)
BUN: 16 mg/dL (ref 8–23)
CO2: 22 mmol/L (ref 22–32)
Calcium: 8.5 mg/dL — ABNORMAL LOW (ref 8.9–10.3)
Chloride: 104 mmol/L (ref 98–111)
Creatinine, Ser: 1.28 mg/dL — ABNORMAL HIGH (ref 0.61–1.24)
GFR calc Af Amer: >60 mL/min
GFR calc non Af Amer: 58 mL/min — ABNORMAL LOW
Glucose, Bld: 115 mg/dL — ABNORMAL HIGH (ref 70–99)
Potassium: 4 mmol/L (ref 3.5–5.1)
Sodium: 138 mmol/L (ref 135–145)

## 2018-12-04 LAB — CBC
HCT: 43 % (ref 39.0–52.0)
Hemoglobin: 14.4 g/dL (ref 13.0–17.0)
MCH: 27.9 pg (ref 26.0–34.0)
MCHC: 33.5 g/dL (ref 30.0–36.0)
MCV: 83.2 fL (ref 80.0–100.0)
Platelets: 287 10*3/uL (ref 150–400)
RBC: 5.17 MIL/uL (ref 4.22–5.81)
RDW: 12.9 % (ref 11.5–15.5)
WBC: 11.5 10*3/uL — ABNORMAL HIGH (ref 4.0–10.5)
nRBC: 0 % (ref 0.0–0.2)

## 2018-12-04 LAB — HEPARIN LEVEL (UNFRACTIONATED): Heparin Unfractionated: 0.33 IU/mL (ref 0.30–0.70)

## 2018-12-04 LAB — PROTEIN S, TOTAL: Protein S Ag, Total: 134 % (ref 60–150)

## 2018-12-04 MED ORDER — APIXABAN 5 MG PO TABS
10.0000 mg | ORAL_TABLET | Freq: Two times a day (BID) | ORAL | Status: DC
  Administered 2018-12-04 – 2018-12-05 (×3): 10 mg via ORAL
  Filled 2018-12-04 (×3): qty 2

## 2018-12-04 MED ORDER — APIXABAN 5 MG PO TABS: 5.0000 mg | ORAL_TABLET | Freq: Two times a day (BID) | ORAL | Status: DC

## 2018-12-04 NOTE — Progress Notes (Signed)
PROGRESS NOTE    Phillip KonigMichael Dalton  ONG:[id number] DOB: 09-13-1951 DOA: 12/01/2018 PCP: No primary care provider on file.     Brief Narrative:  Phillip Dalton is a 67 year old male who presented with complaints of shortness of breath.  This initially started in November, continued to get worse.  He was evaluated in the emergency department, work-up revealed bilateral's severe occlusive pulmonary emboli involving the proximal pulmonary arteries and a majority of the distal pulmonary arteries, with heart strain as well as DVT in the left lower extremity.  He also developed new onset paroxysmal atrial fibrillation.  He has been evaluated by IR, cardiology.  New events last 24 hours / Subjective: No new complaints.  Denies any chest pain or shortness of breath.  No leg pain or swelling.  Assessment & Plan:   Principal Problem:   Pulmonary embolus (HCC) Active Problems:   Pulmonary embolism (HCC)   PAF (paroxysmal atrial fibrillation) (HCC)   Bilateral severe occlusive PE involving the right proximal pulmonary arteries and immaturity of the distal pulmonary arteries, with CT evidence of right heart strain and pulm infarct -On admission admitting physician discussed with the critical care and IR Dr Fredia SorrowYamagata and did not recommend thrombolytics/ and was admitted on heparin drip on the  SDU. -Echocardiogram showed severely dilated and hypokinetic. Hypercoagulable work-up in process, but suspect PTE was associated due to his flights -Plan to start with Eliquis load w 10 mg BID as Dr. Jonathon BellowsKc discussed with IR  -Need follow up echo in 6 months and follow up cardiology in 1 month   Acute DVT of the left peroneal vein -Plan to start with Eliquis load w 10 mg BID as Dr. Jonathon BellowsKc discussed with IR   Fever -Respiratory panel negative, UA negative -Blood cultures pending -CXR without focal consolidation -Could be secondary to blood clot   Paroxysmal atrial fibrillation, new onset -Suspected to be  in the setting of PE as above. In NSR now. He is on cardizem 30 mg q 8hr and if BP remains stable can consider switching to cardizemd 120 mg prior to discharge   Mild AKI vs CKD -Cr at 1.2. Stable    DVT prophylaxis: IV heparin Code Status: Full code Family Communication: At bedside Disposition Plan: Transition to oral anticoagulation and monitor   Consultants:   IR  Cardiology  Procedures:   None  Antimicrobials:  Anti-infectives (From admission, onward)   None        Objective: Vitals:   12/04/18 0616 12/04/18 0756 12/04/18 0800 12/04/18 1200  BP: (!) 139/91 (!) 151/87    Pulse:  77 75 78  Resp:  (!) 24 (!) 23 (!) 24  Temp:  99.6 F (37.6 C)    TempSrc:  Oral    SpO2:  93% 92% 94%  Weight:      Height:        Intake/Output Summary (Last 24 hours) at 12/04/2018 1249 Last data filed at 12/04/2018 1000 Gross per 24 hour  Intake 0 ml  Output 2050 ml  Net -2050 ml   Filed Weights   12/01/18 1205  Weight: 101.6 kg    Examination:  General exam: Appears calm and comfortable  Respiratory system: Clear to auscultation. Respiratory effort normal. Cardiovascular system: S1 & S2 heard, RRR. No JVD, murmurs, rubs, gallops or clicks. No pedal edema. Gastrointestinal system: Abdomen is nondistended, soft and nontender. No organomegaly or masses felt. Normal bowel sounds heard. Central nervous system: Alert and oriented. No focal neurological deficits. Extremities: Symmetric  5 x 5 power. Skin: No rashes, lesions or ulcers Psychiatry: Judgement and insight appear normal. Mood & affect appropriate.   Data Reviewed: I have personally reviewed following labs and imaging studies  CBC: Recent Labs  Lab 12/01/18 1217 12/02/18 0244 12/03/18 0252 12/04/18 0209  WBC 13.7* 11.0* 10.8* 11.5*  HGB 16.5 14.0 13.8 14.4  HCT 49.8 42.0 41.2 43.0  MCV 85.6 84.0 83.2 83.2  PLT 280 257 278 287   Basic Metabolic Panel: Recent Labs  Lab 12/01/18 1217 12/02/18 0244  12/03/18 0252 12/04/18 0209  NA 139 138 138 138  K 3.9 4.4 4.2 4.0  CL 102 107 105 104  CO2 21* 24 22 22   GLUCOSE 144* 124* 115* 115*  BUN 20 23 19 16   CREATININE 1.43* 1.32* 1.25* 1.28*  CALCIUM 9.6 8.7* 8.4* 8.5*  MG 2.2  --   --   --    GFR: Estimated Creatinine Clearance: 70.6 mL/min (A) (by C-G formula based on SCr of 1.28 mg/dL (H)). Liver Function Tests: Recent Labs  Lab 12/01/18 1217  AST 55*  ALT 27  ALKPHOS 58  BILITOT 1.2  PROT 7.7  ALBUMIN 3.7   No results for input(s): LIPASE, AMYLASE in the last 168 hours. No results for input(s): AMMONIA in the last 168 hours. Coagulation Profile: No results for input(s): INR, PROTIME in the last 168 hours. Cardiac Enzymes: Recent Labs  Lab 12/01/18 1217  TROPONINI 0.21*   BNP (last 3 results) No results for input(s): PROBNP in the last 8760 hours. HbA1C: No results for input(s): HGBA1C in the last 72 hours. CBG: No results for input(s): GLUCAP in the last 168 hours. Lipid Profile: No results for input(s): CHOL, HDL, LDLCALC, TRIG, CHOLHDL, LDLDIRECT in the last 72 hours. Thyroid Function Tests: Recent Labs    12/03/18 0252  TSH 2.446   Anemia Panel: No results for input(s): VITAMINB12, FOLATE, FERRITIN, TIBC, IRON, RETICCTPCT in the last 72 hours. Sepsis Labs: No results for input(s): PROCALCITON, LATICACIDVEN in the last 168 hours.  Recent Results (from the past 240 hour(s))  MRSA PCR Screening     Status: None   Collection Time: 12/01/18  9:08 PM  Result Value Ref Range Status   MRSA by PCR NEGATIVE NEGATIVE Final    Comment:        The GeneXpert MRSA Assay (FDA approved for NASAL specimens only), is one component of a comprehensive MRSA colonization surveillance program. It is not intended to diagnose MRSA infection nor to guide or monitor treatment for MRSA infections. Performed at [facility], [address], [place], [place] [address]   Culture, blood (Routine X 2) w Reflex to ID  Panel     Status: None (Preliminary result)   Collection Time: 12/03/18  4:40 PM  Result Value Ref Range Status   Specimen Description BLOOD LEFT ANTECUBITAL  Final   Special Requests   Final    BOTTLES DRAWN AEROBIC ONLY Blood Culture adequate volume Performed at [facility], [address], [place], [place] [address]    Culture NO GROWTH < 12 HOURS  Final   Report Status PENDING  Incomplete  Culture, blood (Routine X 2) w Reflex to ID Panel     Status: None (Preliminary result)   Collection Time: 12/03/18  4:45 PM  Result Value Ref Range Status   Specimen Description BLOOD LEFT ANTECUBITAL  Final   Special Requests   Final    BOTTLES DRAWN AEROBIC ONLY Blood Culture  adequate volume Performed at [facility], [address] [place], [place] [address]    Culture NO GROWTH < 12 HOURS  Final   Report Status PENDING  Incomplete  Respiratory Panel by PCR     Status: None   Collection Time: 12/03/18  6:25 PM  Result Value Ref Range Status   Adenovirus NOT DETECTED NOT DETECTED Final   Coronavirus 229E NOT DETECTED NOT DETECTED Final   Coronavirus HKU1 NOT DETECTED NOT DETECTED Final   Coronavirus NL63 NOT DETECTED NOT DETECTED Final   Coronavirus OC43 NOT DETECTED NOT DETECTED Final   Metapneumovirus NOT DETECTED NOT DETECTED Final   Rhinovirus / Enterovirus NOT DETECTED NOT DETECTED Final   Influenza A NOT DETECTED NOT DETECTED Final   Influenza B NOT DETECTED NOT DETECTED Final   Parainfluenza Virus 1 NOT DETECTED NOT DETECTED Final   Parainfluenza Virus 2 NOT DETECTED NOT DETECTED Final   Parainfluenza Virus 3 NOT DETECTED NOT DETECTED Final   Parainfluenza Virus 4 NOT DETECTED NOT DETECTED Final   Respiratory Syncytial Virus NOT DETECTED NOT DETECTED Final   Bordetella pertussis NOT DETECTED NOT DETECTED Final   Chlamydophila pneumoniae NOT DETECTED NOT DETECTED Final   Mycoplasma pneumoniae NOT DETECTED NOT DETECTED Final    Comment: Performed at [facility], [address] [place], [place] [address]       Radiology Studies: Dg Chest Port 1 View  Result Date: 12/03/2018 CLINICAL DATA:  Pulmonary emboli. EXAM: PORTABLE CHEST 1 VIEW COMPARISON:  12/01/2018 FINDINGS: Hazy right upper lobe airspace opacity persists. Interstitial markings are diffusely coarsened with chronic features. Tiny nodule left mid lung, likely granuloma. Stable appearance of hilar prominence. The visualized bony structures of the thorax are intact. Telemetry leads overlie the chest. IMPRESSION: Stable exam. Electronically Signed   By: Kennith CenterEric  Mansell M.D.   On: 12/03/2018 14:15      Scheduled Meds: . diltiazem  30 mg Oral Q8H   Continuous Infusions: . sodium chloride 75 mL/hr at 12/04/18 1143  . heparin 1,900 Units/hr (12/04/18 1143)     LOS: 3 days    Time spent: 35  minutes   Noralee StainJennifer Cecia Egge, DO [facility] [email or web address] Password [email or web address] 12/04/2018, 12:49 PM

## 2018-12-04 NOTE — Progress Notes (Addendum)
ANTICOAGULATION CONSULT NOTE   Pharmacy Consult for apixaban Indication: DVT in lower left extremity  Patient Measurements: Height: 6\' 4"  (193 cm) Weight: 224 lb (101.6 kg) IBW/kg (Calculated) : 86.8 HEPARIN DW (KG): 101.6  Vital Signs: Temp: 99.6 F (37.6 C) (12/25 0756) Temp Source: Oral (12/25 0756) BP: 151/87 (12/25 0756) Pulse Rate: 77 (12/25 0756)  Labs: Recent Labs    12/01/18 1217  12/02/18 0244  12/03/18 0252 12/03/18 1634 12/04/18 0209  HGB 16.5  --  14.0  --  13.8  --  14.4  HCT 49.8  --  42.0  --  41.2  --  43.0  PLT 280  --  257  --  278  --  287  HEPARINUNFRC  --    < > 0.30   < > 0.28* 0.32 0.33  CREATININE 1.43*  --  1.32*  --  1.25*  --  1.28*  TROPONINI 0.21*  --   --   --   --   --   --    < > = values in this interval not displayed.    Estimated Creatinine Clearance: 70.6 mL/min (A) (by C-G formula based on SCr of 1.28 mg/dL (H)).   Medical History: History reviewed. No pertinent past medical history.  Assessment: 67 y.o. M with no known history of heart disease presenting with atrial fibrillation with RVR now found to have severe occlusive bilateral pulmonary emboli. Pharmacy has been consulted to switch patient from heparin gtt to apixaban for the treatment of DVT of left peroneal vein.   Heparin level therapeutic at 0.33 units/mL. Hgb/Hct and platelet count stable this AM. No signs/symptoms of bleeding noted per nurse.   Goal of Therapy:  Heparin level 0.3-0.7 units/ml Monitor platelets by anticoagulation protocol: Yes   Plan:  Stop heparin gtt Start apixaban 10 mg PO BID x 7 days (starting 12/04/18) followed by apixaban 5 mg BID starting on 12/11/2018 Monitor daily CBC, s/sx bleeding  Thank you for allowing pharmacy to be a part of this patient's care.  Lenord Carboebecca Brian Kocourek, PharmD PGY1 Pharmacy Resident Phone: [phone]  Please check AMION for all [facility] Pharmacy phone numbers 12/04/2018    8:34 AM

## 2018-12-05 DIAGNOSIS — I2694 Multiple subsegmental pulmonary emboli without acute cor pulmonale: Secondary | ICD-10-CM | POA: Diagnosis not present

## 2018-12-05 DIAGNOSIS — I2609 Other pulmonary embolism with acute cor pulmonale: Secondary | ICD-10-CM | POA: Diagnosis not present

## 2018-12-05 LAB — ANTIPHOSPHOLIPID SYNDROME EVAL, BLD
Anticardiolipin IgA: 17 APL U/mL — ABNORMAL HIGH (ref 0–11)
Anticardiolipin IgG: <9 GPL U/mL (ref 0–14)
Anticardiolipin IgM: 10 MPL U/mL (ref 0–12)
DRVVT: 71.8 s — ABNORMAL HIGH (ref 0.0–47.0)
PTT Lupus Anticoagulant: 50 s (ref 0.0–51.9)
Phosphatydalserine, IgA: 14 APS IgA (ref 0–20)
Phosphatydalserine, IgG: 6 GPS IgG (ref 0–11)
Phosphatydalserine, IgM: 19 MPS IgM (ref 0–25)

## 2018-12-05 LAB — BASIC METABOLIC PANEL
Anion gap: 11 (ref 5–15)
BUN: 12 mg/dL (ref 8–23)
CO2: 22 mmol/L (ref 22–32)
Calcium: 8.5 mg/dL — ABNORMAL LOW (ref 8.9–10.3)
Chloride: 104 mmol/L (ref 98–111)
Creatinine, Ser: 1.24 mg/dL (ref 0.61–1.24)
GFR calc Af Amer: >60 mL/min (ref >60)
GFR calc non Af Amer: >60 mL/min (ref >60)
Glucose, Bld: 116 mg/dL — ABNORMAL HIGH (ref 70–99)
Potassium: 4.3 mmol/L (ref 3.5–5.1)
Sodium: 137 mmol/L (ref 135–145)

## 2018-12-05 LAB — CBC
HCT: 39.5 % (ref 39.0–52.0)
Hemoglobin: 13.5 g/dL (ref 13.0–17.0)
MCH: 28.3 pg (ref 26.0–34.0)
MCHC: 34.2 g/dL (ref 30.0–36.0)
MCV: 82.8 fL (ref 80.0–100.0)
Platelets: 292 10*3/uL (ref 150–400)
RBC: 4.77 MIL/uL (ref 4.22–5.81)
RDW: 12.9 % (ref 11.5–15.5)
WBC: 13.5 10*3/uL — ABNORMAL HIGH (ref 4.0–10.5)
nRBC: 0 % (ref 0.0–0.2)

## 2018-12-05 LAB — DRVVT CONFIRM: dRVVT Confirm: 1.5 ratio — ABNORMAL HIGH (ref 0.8–1.2)

## 2018-12-05 LAB — DRVVT MIX: dRVVT Mix: 48 s — ABNORMAL HIGH (ref 0.0–47.0)

## 2018-12-05 LAB — PROTEIN C, TOTAL: Protein C, Total: 73 % (ref 60–150)

## 2018-12-05 MED ORDER — DILTIAZEM HCL ER COATED BEADS 120 MG PO CP24: 120.0000 mg | ORAL_CAPSULE | Freq: Every day | ORAL | 0 refills | Status: DC

## 2018-12-05 MED ORDER — APIXABAN 5 MG PO TABS: ORAL_TABLET | ORAL | 0 refills | Status: DC

## 2018-12-05 MED FILL — CARTIA XT 120 MG CP24: 120 | 30 days supply | Qty: 30 | Fill #0

## 2018-12-05 MED FILL — ELIQUIS STARTER PACK 5 MG T: 5 | 30 days supply | Qty: 74 | Fill #0

## 2018-12-05 NOTE — Discharge Instructions (Signed)
Atrial Fibrillation Atrial fibrillation is a type of irregular or rapid heartbeat (arrhythmia). In atrial fibrillation, the top part of the heart (atria) quivers in a chaotic pattern. This makes the heart unable to pump blood normally. Having atrial fibrillation can increase your risk for other health problems, such as:  Blood can pool in the atria and form clots. If a clot travels to the brain, it can cause a stroke.  The heart muscle may weaken from the irregular blood flow. This can cause heart failure. Atrial fibrillation may start suddenly and stop on its own, or it may become a long-lasting problem. What are the causes? This condition is caused by some heart-related conditions or procedures, including:  High blood pressure. This is the most common cause.  Heart failure.  Heart valve conditions.  Inflammation of the sac that surrounds the heart (pericarditis).  Heart surgery.  Coronary artery disease.  Certain heart rhythm disorders, such as Wolf-Parkinson-White syndrome. Other causes include:  Pneumonia.  Obstructive sleep apnea.  Lung cancer.  Thyroid problems, especially if the thyroid is overactive (hyperthyroidism).  Excessive alcohol or drug use. Sometimes, the cause of this condition is not known. What increases the risk? This condition is more likely to develop in:  Older people.  People who smoke.  People who have diabetes mellitus.  People who are overweight (obese).  Athletes who exercise vigorously.  People who have a family history. What are the signs or symptoms? Symptoms of this condition include:  A feeling that your heart is beating rapidly or irregularly.  A feeling of discomfort or pain in your chest.  Shortness of breath.  Sudden light-headedness or weakness.  Getting tired easily during exercise. In some cases, there are no symptoms. How is this diagnosed? Your health care provider may be able to detect atrial fibrillation when  taking your pulse. If detected, this condition may be diagnosed with:  Electrocardiogram (ECG).  Ambulatory cardiac monitor. This device records your heartbeats for 24 hours or more.  Transthoracic echocardiogram (TTE) to evaluate how blood flows through your heart.  Transesophageal echocardiogram (TEE) to view more detailed images of your heart.  A stress test.  Imaging tests, such as a CT scan or chest X-ray.  Blood tests. How is this treated? This condition may be treated with:  Medicines to slow down the heart rate or bring the heart's rhythm back to normal.  Medicines to prevent blood clots from forming.  Electrical cardioversion. This delivers a low-energy shock to the heart to reset its rhythm.  Ablation. This procedure destroys the part of the heart tissue that sends abnormal signals.  Left atrial appendage occlusion/excision. This seals off a common place in the atria where blood clots can form (left atrial appendage). The goal of treatment is to prevent blood clots from forming and to keep your heart beating at a normal rate and rhythm. Treatment depends on underlying medical conditions and how you feel when you are experiencing fibrillation. Follow these instructions at home: Medicines  Take over-the counter and prescription medicines only as told by your health care provider.  If your health care provider prescribed a blood-thinning medicine (anticoagulant), take it exactly as told. Taking too much blood-thinning medicine can cause bleeding. Taking too little can enable a blood clot to form and travel to the brain, causing a stroke. Lifestyle      Do not use any products that contain nicotine or tobacco, such as cigarettes and e-cigarettes. If you need help quitting, ask your health  care provider.  Do not drink beverages that contain caffeine, such as coffee, soda, and tea.  Follow diet instructions as told by your health care provider.  Exercise regularly as  told by your health care provider.  Do not drink alcohol. General instructions  If you have obstructive sleep apnea, manage your condition as told by your health care provider.  Maintain a healthy weight. Do not use diet pills unless your health care provider approves. Diet pills may make heart problems worse.  Keep all follow-up visits as told by your health care provider. This is important. Contact a health care provider if you:  Notice a change in the rate, rhythm, or strength of your heartbeat.  Are taking an anticoagulant and you notice increased bruising.  Tire more easily when you exercise or exert yourself.  Have a sudden change in weight. Get help right away if you have:   Chest pain, abdominal pain, sweating, or weakness.  Difficulty breathing.  Blood in your vomit, stool (feces), or urine.  Any symptoms of a stroke. "BE FAST" is an easy way to remember the main warning signs of a stroke: ? B - Balance. Signs are dizziness, sudden trouble walking, or loss of balance. ? E - Eyes. Signs are trouble seeing or a sudden change in vision. ? F - Face. Signs are sudden weakness or numbness of the face, or the face or eyelid drooping on one side. ? A - Arms. Signs are weakness or numbness in an arm. This happens suddenly and usually on one side of the body. ? S - Speech. Signs are sudden trouble speaking, slurred speech, or trouble understanding what people say. ? T - Time. Time to call emergency services. Write down what time symptoms started.  Other signs of a stroke, such as: ? A sudden, severe headache with no known cause. ? Nausea or vomiting. ? Seizure. These symptoms may represent a serious problem that is an emergency. Do not wait to see if the symptoms will go away. Get medical help right away. Call your local emergency services (911 in the [place] Do not drive yourself to the hospital. Summary  Atrial fibrillation is a type of irregular or rapid heartbeat  (arrhythmia).  Symptoms include a feeling that your heart is beating fast or irregularly. In some cases, you may not have symptoms.  The condition is treated with medicines to slow down the heart rate or bring the heart's rhythm back to normal. You may also need blood-thinning medicines to prevent blood clots.  Get help right away if you have symptoms or signs of a stroke. This information is not intended to replace advice given to you by your health care provider. Make sure you discuss any questions you have with your health care provider. Document Released: 11/27/2005 Document Revised: 01/18/2018 Document Reviewed: 01/18/2018 Elsevier Interactive Patient Education  2019 [employer]     Pulmonary Embolism  A pulmonary embolism (PE) is a sudden blockage or decrease of blood flow in one lung or both lungs. Most blockages come from a blood clot that forms in a lower leg, thigh, or arm vein (deep vein thrombosis, DVT) and travels to the lungs. A clot is blood that has thickened into a gel or solid. PE is a dangerous and life-threatening condition that needs to be treated right away. What are the causes? This condition is usually caused by a blood clot that forms in a vein and moves to the lungs. In rare cases, it may be  caused by air, fat, part of a tumor, or other tissue that moves through the veins and into the lungs. What increases the risk? The following factors may make you more likely to develop this condition:  Traumatic injury, such as breaking a hip or leg.  Spinal cord injury.  Orthopedic surgery, especially hip or knee replacement.  Any major surgery.  Stroke.  Having DVT.  Blood clots or blood clotting disease.  Long-term (chronic) lung or heart disease.  Taking medicines that contain estrogen. These include birth control pills and hormone replacement therapy.  Cancer and chemotherapy.  Having a central venous catheter.  Pregnancy and the period of time after  delivery (postpartum).  Being older than age 65.  Being overweight.  Smoking. What are the signs or symptoms? Symptoms of this condition usually start suddenly and include:  Shortness of breath during activity or at rest.  Coughing or coughing up blood or blood-tinged mucus.  Chest pain that is often worse with deep breaths.  Rapid or irregular heartbeat.  Feeling light-headed or dizzy.  Fainting.  Feeling anxious.  Fever.  Sweating.  Pain and swelling in a leg. This is a symptom of DVT, which can lead to PE. How is this diagnosed? This condition may be diagnosed based on:  Your medical history.  A physical exam.  Blood tests.  CT pulmonary angiogram. This test checks blood flow in and around your lungs.  Ventilation-perfusion scan, also called a lung VQ scan. This test measures air flow and blood flow to the lungs.  Ultrasound of the legs. How is this treated? Treatment for this condition depends on many factors, such as the cause of your PE, your risk for bleeding or developing more clots, and other medical conditions you have. Treatment aims to remove, dissolve, or stop blood clots from forming or growing larger. Treatment may include:  Medicines, such as: ? Blood thinning medicines (anticoagulants) to stop clots from forming or growing. ? Medicines that dissolve clots (thrombolytics).  Procedures, such as: ? Using a flexible tube to remove a blood clot (embolectomy) or deliver medicine to destroy it (catheter-directed thrombolysis). ? Inserting a filter into a large vein that carries blood to the heart (inferior vena cava). This filter (vena cava filter) catches blood clots before they reach the lungs. ? Surgery to remove the clot (surgical embolectomy). This is rare. You may need a combination of immediate, long-term (up to 3 months after diagnosis), and extended (more than 3 months after diagnosis) treatments. Your treatment may continue for several months  (maintenance therapy). You and your health care provider will work together to choose the treatment program that is best for you. Follow these instructions at home: Medicines  Take over-the-counter and prescription medicines only as told by your health care provider.  If you are taking an anticoagulant medicine: ? Take the medicine every day at the same time each day. ? Understand what foods and drugs interact with your medicine. ? Understand the side effects of this medicine, including excessive bruising or bleeding. Ask your health care provider or pharmacist about other side effects. General instructions  Wear a medical alert bracelet or carry a medical alert card that says you have had a PE and lists what medicines you take.  Ask your health care provider when you may return to your normal activities. Avoid sitting or lying for a long time without moving.  Maintain a healthy weight. Ask your health care provider what weight is healthy for you.  Do  not use any products that contain nicotine or tobacco, such as cigarettes and e-cigarettes. If you need help quitting, ask your health care provider.  Talk with your health care provider about any travel plans. It is important to make sure that you are still able to take your medicine while on trips.  Keep all follow-up visits as told by your health care provider. This is important. Contact a health care provider if:  You missed a dose of your blood thinner medicine. Get help right away if:  You have: ? New or increased pain, swelling, warmth, or redness in an arm or leg. ? Numbness or tingling in an arm or leg. ? Shortness of breath during activity or at rest. ? A fever. ? Chest pain. ? A rapid or irregular heartbeat. ? A severe headache. ? Vision changes. ? A serious fall or accident, or you hit your head. ? Stomach (abdominal) pain. ? Blood in your vomit, stool, or urine. ? A cut that will not stop bleeding.  You cough up  blood.  You feel light-headed or dizzy.  You cannot move your arms or legs.  You are confused or have memory loss. These symptoms may represent a serious problem that is an emergency. Do not wait to see if the symptoms will go away. Get medical help right away. Call your local emergency services (911 in the [place] Do not drive yourself to the hospital. Summary  A pulmonary embolism (PE) is a sudden blockage or decrease of blood flow in one lung or both lungs. PE is a dangerous and life-threatening condition that needs to be treated right away.  Treatments for this condition usually include medicines to thin your blood (anticoagulants) or medicines to break apart blood clots (thrombolytics).  If you are given blood thinners, it is important to take the medicine every single day at the same time each day.  If you have signs of PE or DVT, call your local emergency services (911 in the [place] This information is not intended to replace advice given to you by your health care provider. Make sure you discuss any questions you have with your health care provider. Document Released: 11/24/2000 Document Revised: 07/12/2018 Document Reviewed: 01/10/2018 Elsevier Interactive Patient Education  2019 [employer]  Information on my medicine - ELIQUIS (apixaban)  This medication education was reviewed with me or my healthcare representative as part of my discharge preparation.  The pharmacist that spoke with me during my hospital stay was:  [facility], Maryanna ShapeJennifer Danielle, [facility] CenterRPH  Why was Eliquis prescribed for you? Eliquis was prescribed to treat blood clots that may have been found in the veins of your legs (deep vein thrombosis) or in your lungs (pulmonary embolism) and to reduce the risk of them occurring again.  What do You need to know about Eliquis ? The starting dose is 10 mg (two 5 mg tablets) taken TWICE daily for the FIRST SEVEN (7) DAYS, then on 12/11/18  the dose is reduced to ONE 5 mg tablet  taken TWICE daily.  Eliquis may be taken with or without food.   Try to take the dose about the same time in the morning and in the evening. If you have difficulty swallowing the tablet whole please discuss with your pharmacist how to take the medication safely.  Take Eliquis exactly as prescribed and DO NOT stop taking Eliquis without talking to the doctor who prescribed the medication.  Stopping may increase your risk of developing a new blood clot.  Refill your prescription before you run out.  After discharge, you should have regular check-up appointments with your healthcare provider that is prescribing your Eliquis.    What do you do if you miss a dose? If a dose of ELIQUIS is not taken at the scheduled time, take it as soon as possible on the same day and twice-daily administration should be resumed. The dose should not be doubled to make up for a missed dose.  Important Safety Information A possible side effect of Eliquis is bleeding. You should call your healthcare provider right away if you experience any of the following: ? Bleeding from an injury or your nose that does not stop. ? Unusual colored urine (red or dark brown) or unusual colored stools (red or black). ? Unusual bruising for unknown reasons. ? A serious fall or if you hit your head (even if there is no bleeding).  Some medicines may interact with Eliquis and might increase your risk of bleeding or clotting while on Eliquis. To help avoid this, consult your healthcare provider or pharmacist prior to using any new prescription or non-prescription medications, including herbals, vitamins, non-steroidal anti-inflammatory drugs (NSAIDs) and supplements.  This website has more information on Eliquis (apixaban): [email or web address].[email or web address]

## 2018-12-05 NOTE — Discharge Summary (Signed)
Physician Discharge Summary  Phillip Dalton ZOX:[id number] DOB: 08-24-1951 DOA: 12/01/2018  PCP: No primary care provider on file.  Admit date: 12/01/2018 Discharge date: 12/05/2018  Admitted From: Home Disposition:  Home  Recommendations for Outpatient Follow-up:  1. Follow up with PCP in 1 week 2. Follow up with Cardiology in 1 month, 01/03/2019  3. Follow up outpatient echocardiogram 6 months 4. Eliquis 10mg  BID 12/25-12/31, then 5mg  BID starting 1/1 5. Check CBC in 1 week  6. Follow up final blood culture result, negative at day of discharge   Discharge Condition: Stable CODE STATUS: Full  Diet recommendation: Heart healthy   Brief/Interim Summary: Phillip Dalton is a 67 year old male who presented with complaints of shortness of breath.  This initially started in November, continued to get worse.  He was evaluated in the emergency department, work-up revealed bilateral's severe occlusive pulmonary emboli involving the proximal pulmonary arteries and a majority of the distal pulmonary arteries, with heart strain as well as DVT in the left lower extremity.  He also developed new onset paroxysmal atrial fibrillation.  He has been evaluated by IR, cardiology.  Discharge Diagnoses:  Principal Problem:   Pulmonary embolus (HCC) Active Problems:   Pulmonary embolism (HCC)   PAF (paroxysmal atrial fibrillation) (HCC)   Bilateral severe occlusive PE involving the right proximal pulmonary arteries and immaturity of the distal pulmonary arteries, with CT evidence of right heart strain and pulm infarct -On admission admitting physician discussed with the critical care and IR Dr Fredia Sorrow and did not recommend thrombolytics/ and was admitted on heparin drip on the SDU.  -Echocardiogramshowedseverely dilated and hypokinetic. Hypercoagulable work-up in process, butsuspect PTE was associated due to his flights -Plan to start with Eliquis load w 10 mg BID for 1 week then 5 mg BID   -Need follow up echo in 6 months and follow up cardiology in 1 month   Acute DVT of the left peroneal vein -Plan to start with Eliquis load w 10 mg BID for 1 week then 5 mg BID   Fever -Respiratory panel negative, UA negative -Blood cultures pending -CXR without focal consolidation -Could be secondary to blood clot   Paroxysmal atrial fibrillation, new onset -Suspected to be in the setting of PE as above. In NSR now. Cardizem 120 mg on discharge  -Plan to start with Eliquis load w 10 mg BID for 1 week then 5 mg BID   Mild AKIvs CKD -Cr at 1.2. Stable    Discharge Instructions  Discharge Instructions    Amb referral to [facility]   Complete by:  As directed    Call MD for:   Complete by:  As directed    Chest pain, leg pain or leg swelling, shortness of breath, dizziness, heart palpitations   Call MD for:  difficulty breathing, headache or visual disturbances   Complete by:  As directed    Call MD for:  extreme fatigue   Complete by:  As directed    Call MD for:  hives   Complete by:  As directed    Call MD for:  persistant dizziness or light-headedness   Complete by:  As directed    Call MD for:  persistant nausea and vomiting   Complete by:  As directed    Call MD for:  severe uncontrolled pain   Complete by:  As directed    Call MD for:  temperature >100.4   Complete by:  As directed    Diet - low sodium heart  healthy   Complete by:  As directed    Discharge instructions   Complete by:  As directed    You were cared for by a hospitalist during your hospital stay. If you have any questions about your discharge medications or the care you received while you were in the hospital after you are discharged, you can call the unit and ask to speak with the hospitalist on call if the hospitalist that took care of you is not available. Once you are discharged, your primary care physician will handle any further medical issues. Please note that NO REFILLS for any discharge  medications will be authorized once you are discharged, as it is imperative that you return to your primary care physician (or establish a relationship with a primary care physician if you do not have one) for your aftercare needs so that they can reassess your need for medications and monitor your lab values.   Increase activity slowly   Complete by:  As directed      Allergies as of 12/05/2018   Not on File     Medication List    STOP taking these medications   naproxen sodium 220 MG tablet Commonly known as:  ALEVE     TAKE these medications   apixaban 5 MG Tabs tablet Commonly known as:  ELIQUIS Take 10mg  (2 tablets) twice a day for 1 week (until 12/10/18) then start 5mg  (1 tablet) twice a day starting 12/11/2018   diltiazem 120 MG 24 hr capsule Commonly known as:  CARDIZEM CD Take 1 capsule (120 mg total) by mouth daily.      Follow-up Information    Marykay Lex, MD. Go on 01/03/2019.   Specialty:  Cardiology Why:  @1 :40pm for hospital follow up. Please arrive 15 minutes early  Contact information: [address] [place] [address] [phone]        Your primary care physician. Schedule an appointment as soon as possible for a visit in 1 week(s).          Not on File  Consultations:  Cardiology  IR    Procedures/Studies: Ct Angio Chest Pe W And/or Wo Contrast  Result Date: 12/01/2018 CLINICAL DATA:  Short of breath.  Acute onset. EXAM: CT ANGIOGRAPHY CHEST WITH CONTRAST TECHNIQUE: Multidetector CT imaging of the chest was performed using the standard protocol during bolus administration of intravenous contrast. Multiplanar CT image reconstructions and MIPs were obtained to evaluate the vascular anatomy. CONTRAST:  75mL ISOVUE-370 IOPAMIDOL (ISOVUE-370) INJECTION 76% COMPARISON:  None. FINDINGS: Cardiovascular: There are large filling defects within the distal LEFT and RIGHT pulmonary arteries which are occlusive. Emboli extend into the  lower lobe pulmonary arteries and upper lobe pulmonary arteries. Emboli extend into the RIGHT middle lobe and lingula. Overall clot burden is severe. There is evidence of RIGHT ventricular strain with the ratio of the RIGHT ventricular diameter ratio to LEFT ventricular diameter greater than 1 (RV/LV equal 1.44). There is flattening of the intraventricular septum along the RIGHT ventricular interface. Mediastinum/Nodes: No axillary supraclavicular adenopathy. No mediastinal hilar adenopathy. No pericardial effusion. Lungs/Pleura: There is fluffy airspace disease in the posterior segment of the RIGHT lower lobe. Subtle peripheral airspace disease in the lateral segment of the RIGHT lower lobe (image 59/7). Calcified nodule in the LEFT upper lobe is small. Upper Abdomen: Limited view of the liver, kidneys, pancreas are unremarkable. Normal adrenal glands. Musculoskeletal: No aggressive osseous lesion. Review of the MIP images confirms the above findings. IMPRESSION:  1. Bilateral severe occlusive pulmonary emboli involving the proximal pulmonary arteries and a majority of the distal pulmonary arteries. 2. Positive for acute PE with CT evidence of right heart strain (RV/LV Ratio = 1.4) consistent with at least submassive (intermediate risk) PE. The presence of right heart strain has been associated with an increased risk of morbidity and mortality. Please activate Code PE by paging [phone]. 3. Airspace disease in the posterior RIGHT lower lobe and to lesser degree the lateral RIGHT lower lobe is presumed mild pulmonary infarction. Critical Value/emergent results were called by telephone at the time of interpretation on 12/01/2018 at 2:43 pm to Dr. Virgina Norfolk, who verbally acknowledged these results. Electronically Signed   By: Genevive Bi M.D.   On: 12/01/2018 14:46   Dg Chest Port 1 View  Result Date: 12/03/2018 CLINICAL DATA:  Pulmonary emboli. EXAM: PORTABLE CHEST 1 VIEW COMPARISON:  12/01/2018  FINDINGS: Hazy right upper lobe airspace opacity persists. Interstitial markings are diffusely coarsened with chronic features. Tiny nodule left mid lung, likely granuloma. Stable appearance of hilar prominence. The visualized bony structures of the thorax are intact. Telemetry leads overlie the chest. IMPRESSION: Stable exam. Electronically Signed   By: Kennith Center M.D.   On: 12/03/2018 14:15   Dg Chest Port 1 View  Result Date: 12/01/2018 CLINICAL DATA:  Shortness of breath. EXAM: PORTABLE CHEST 1 VIEW COMPARISON:  None. FINDINGS: Lungs are adequately inflated with mild hazy density over the right upper lobe/apex which may be true parenchymal process versus overlapping bony and vascular structures. No effusion. Cardiomediastinal silhouette and remainder the exam is unremarkable. IMPRESSION: Mild hazy density over the right upper lobe/apex which may be true parenchymal process versus overlapping bony and vascular structures. Consider PA and lateral chest radiograph for further evaluation. Electronically Signed   By: Elberta Fortis M.D.   On: 12/01/2018 13:02   Vas [place] Lower Extremity Venous (dvt)  Result Date: 12/02/2018  Lower Venous Study Risk Factors: Confirmed PE. Performing Technologist: Jeb Levering RDMS, RVT  Examination Guidelines: A complete evaluation includes B-mode imaging, spectral Doppler, color Doppler, and power Doppler as needed of all accessible portions of each vessel. Bilateral testing is considered an integral part of a complete examination. Limited examinations for reoccurring indications may be performed as noted.  Right Venous Findings: +---------+---------------+---------+-----------+----------+-------+          CompressibilityPhasicitySpontaneityPropertiesSummary +---------+---------------+---------+-----------+----------+-------+ CFV      Full           Yes      Yes                          +---------+---------------+---------+-----------+----------+-------+ SFJ       Full                                                 +---------+---------------+---------+-----------+----------+-------+ FV Prox  Full                                                 +---------+---------------+---------+-----------+----------+-------+ FV Mid   Full                                                 +---------+---------------+---------+-----------+----------+-------+  FV DistalFull                                                 +---------+---------------+---------+-----------+----------+-------+ PFV      Full                                                 +---------+---------------+---------+-----------+----------+-------+ POP      Full           Yes      Yes                          +---------+---------------+---------+-----------+----------+-------+ PTV      Full                                                 +---------+---------------+---------+-----------+----------+-------+ PERO     Full                                                 +---------+---------------+---------+-----------+----------+-------+  Left Venous Findings: +---------+---------------+---------+-----------+----------+-------+          CompressibilityPhasicitySpontaneityPropertiesSummary +---------+---------------+---------+-----------+----------+-------+ CFV      Full           Yes      Yes                          +---------+---------------+---------+-----------+----------+-------+ SFJ      Full                                                 +---------+---------------+---------+-----------+----------+-------+ FV Prox  Full                                                 +---------+---------------+---------+-----------+----------+-------+ FV Mid   Full                                                 +---------+---------------+---------+-----------+----------+-------+ FV DistalFull                                                  +---------+---------------+---------+-----------+----------+-------+ PFV      Full                                                 +---------+---------------+---------+-----------+----------+-------+  POP      Full           Yes      Yes                          +---------+---------------+---------+-----------+----------+-------+ PTV      Full                                                 +---------+---------------+---------+-----------+----------+-------+ PERO     None                                         Acute   +---------+---------------+---------+-----------+----------+-------+    Summary: Right: There is no evidence of deep vein thrombosis in the lower extremity. A cystic structure is found in the popliteal fossa. Left: Findings consistent with acute deep vein thrombosis involving the left peroneal vein. No cystic structure found in the popliteal fossa.  *See table(s) above for measurements and observations. Electronically signed by Gretta Began MD on 12/02/2018 at 1:44:42 PM.    Final     Echo Study Conclusions  - Left ventricle: Wall thickness was increased in a pattern of mild   LVH. There was mild focal basal hypertrophy of the septum.   Systolic function was normal. The estimated ejection fraction was   in the range of 55% to 60%. - Aortic valve: Valve area (VTI): 3.76 cm^2. Valve area (Vmean):   3.29 cm^2. - Mitral valve: There was mild regurgitation. - Right ventricle: Severely dilated and hypokinetic. - Right atrium: The atrium was severely dilated. - Atrial septum: No defect or patent foramen ovale was identified. - Pulmonary arteries: PA peak pressure: 42 mm Hg (S).      Discharge Exam: Vitals:   12/04/18 2313 12/05/18 0841  BP: (!) 175/84 (!) 149/78  Pulse: (!) 118 76  Resp:  18  Temp: 98.7 F (37.1 C) 98.7 F (37.1 C)  SpO2: 92% 95%     General: Pt is alert, awake, not in acute distress Cardiovascular: RRR, S1/S2 +, no rubs, no  gallops Respiratory: CTA bilaterally, no wheezing, no rhonchi, on room air  Abdominal: Soft, NT, ND, bowel sounds + Extremities: no edema, no cyanosis    The results of significant diagnostics from this hospitalization (including imaging, microbiology, ancillary and laboratory) are listed below for reference.     Microbiology: Recent Results (from the past 240 hour(s))  MRSA PCR Screening     Status: None   Collection Time: 12/01/18  9:08 PM  Result Value Ref Range Status   MRSA by PCR NEGATIVE NEGATIVE Final    Comment:        The GeneXpert MRSA Assay (FDA approved for NASAL specimens only), is one component of a comprehensive MRSA colonization surveillance program. It is not intended to diagnose MRSA infection nor to guide or monitor treatment for MRSA infections. Performed at [facility], [address], [place], [place] [address]   Culture, blood (Routine X 2) w Reflex to ID Panel     Status: None (Preliminary result)   Collection Time: 12/03/18  4:40 PM  Result Value Ref Range Status   Specimen Description BLOOD LEFT ANTECUBITAL  Final   Special Requests  Final    BOTTLES DRAWN AEROBIC ONLY Blood Culture adequate volume   Culture   Final    NO GROWTH < 24 HOURS Performed at [facility], [address], [place], [place] [address]    Report Status PENDING  Incomplete  Culture, blood (Routine X 2) w Reflex to ID Panel     Status: None (Preliminary result)   Collection Time: 12/03/18  4:45 PM  Result Value Ref Range Status   Specimen Description BLOOD LEFT ANTECUBITAL  Final   Special Requests   Final    BOTTLES DRAWN AEROBIC ONLY Blood Culture adequate volume   Culture   Final    NO GROWTH < 24 HOURS Performed at [facility], [address] [place], [place] [address]    Report Status PENDING  Incomplete  Respiratory Panel by PCR     Status: None   Collection Time: 12/03/18  6:25 PM  Result Value Ref Range Status   Adenovirus NOT DETECTED  NOT DETECTED Final   Coronavirus 229E NOT DETECTED NOT DETECTED Final   Coronavirus HKU1 NOT DETECTED NOT DETECTED Final   Coronavirus NL63 NOT DETECTED NOT DETECTED Final   Coronavirus OC43 NOT DETECTED NOT DETECTED Final   Metapneumovirus NOT DETECTED NOT DETECTED Final   Rhinovirus / Enterovirus NOT DETECTED NOT DETECTED Final   Influenza A NOT DETECTED NOT DETECTED Final   Influenza B NOT DETECTED NOT DETECTED Final   Parainfluenza Virus 1 NOT DETECTED NOT DETECTED Final   Parainfluenza Virus 2 NOT DETECTED NOT DETECTED Final   Parainfluenza Virus 3 NOT DETECTED NOT DETECTED Final   Parainfluenza Virus 4 NOT DETECTED NOT DETECTED Final   Respiratory Syncytial Virus NOT DETECTED NOT DETECTED Final   Bordetella pertussis NOT DETECTED NOT DETECTED Final   Chlamydophila pneumoniae NOT DETECTED NOT DETECTED Final   Mycoplasma pneumoniae NOT DETECTED NOT DETECTED Final    Comment: Performed at [facility], [address], [place], [place] [address]     Labs: BNP (last 3 results) No results for input(s): BNP in the last 8760 hours. Basic Metabolic Panel: Recent Labs  Lab 12/01/18 1217 12/02/18 0244 12/03/18 0252 12/04/18 0209 12/05/18 0321  NA 139 138 138 138 137  K 3.9 4.4 4.2 4.0 4.3  CL 102 107 105 104 104  CO2 21* 24 22 22 22   GLUCOSE 144* 124* 115* 115* 116*  BUN 20 23 19 16 12   CREATININE 1.43* 1.32* 1.25* 1.28* 1.24  CALCIUM 9.6 8.7* 8.4* 8.5* 8.5*  MG 2.2  --   --   --   --    Liver Function Tests: Recent Labs  Lab 12/01/18 1217  AST 55*  ALT 27  ALKPHOS 58  BILITOT 1.2  PROT 7.7  ALBUMIN 3.7   No results for input(s): LIPASE, AMYLASE in the last 168 hours. No results for input(s): AMMONIA in the last 168 hours. CBC: Recent Labs  Lab 12/01/18 1217 12/02/18 0244 12/03/18 0252 12/04/18 0209 12/05/18 0321  WBC 13.7* 11.0* 10.8* 11.5* 13.5*  HGB 16.5 14.0 13.8 14.4 13.5  HCT 49.8 42.0 41.2 43.0 39.5  MCV 85.6 84.0 83.2 83.2 82.8  PLT 280 257  278 287 292   Cardiac Enzymes: Recent Labs  Lab 12/01/18 1217  TROPONINI 0.21*   BNP: Invalid input(s): POCBNP CBG: No results for input(s): GLUCAP in the last 168 hours. D-Dimer No results for input(s): DDIMER in the last 72 hours. Hgb A1c No results for input(s): HGBA1C  in the last 72 hours. Lipid Profile No results for input(s): CHOL, HDL, LDLCALC, TRIG, CHOLHDL, LDLDIRECT in the last 72 hours. Thyroid function studies Recent Labs    12/03/18 0252  TSH 2.446   Anemia work up No results for input(s): VITAMINB12, FOLATE, FERRITIN, TIBC, IRON, RETICCTPCT in the last 72 hours. Urinalysis    Component Value Date/Time   COLORURINE YELLOW 12/03/2018 1755   APPEARANCEUR CLEAR 12/03/2018 1755   LABSPEC 1.013 12/03/2018 1755   PHURINE 6.0 12/03/2018 1755   GLUCOSEU NEGATIVE 12/03/2018 1755   HGBUR MODERATE (A) 12/03/2018 1755   BILIRUBINUR NEGATIVE 12/03/2018 1755   KETONESUR 5 (A) 12/03/2018 1755   PROTEINUR NEGATIVE 12/03/2018 1755   NITRITE NEGATIVE 12/03/2018 1755   LEUKOCYTESUR NEGATIVE 12/03/2018 1755   Sepsis Labs Invalid input(s): PROCALCITONIN,  WBC,  LACTICIDVEN Microbiology Recent Results (from the past 240 hour(s))  MRSA PCR Screening     Status: None   Collection Time: 12/01/18  9:08 PM  Result Value Ref Range Status   MRSA by PCR NEGATIVE NEGATIVE Final    Comment:        The GeneXpert MRSA Assay (FDA approved for NASAL specimens only), is one component of a comprehensive MRSA colonization surveillance program. It is not intended to diagnose MRSA infection nor to guide or monitor treatment for MRSA infections. Performed at [facility], [address] [place], [place] [address]   Culture, blood (Routine X 2) w Reflex to ID Panel     Status: None (Preliminary result)   Collection Time: 12/03/18  4:40 PM  Result Value Ref Range Status   Specimen Description BLOOD LEFT ANTECUBITAL  Final   Special Requests   Final    BOTTLES DRAWN AEROBIC  ONLY Blood Culture adequate volume   Culture   Final    NO GROWTH < 24 HOURS Performed at [facility], [address] [place], [place] [address]    Report Status PENDING  Incomplete  Culture, blood (Routine X 2) w Reflex to ID Panel     Status: None (Preliminary result)   Collection Time: 12/03/18  4:45 PM  Result Value Ref Range Status   Specimen Description BLOOD LEFT ANTECUBITAL  Final   Special Requests   Final    BOTTLES DRAWN AEROBIC ONLY Blood Culture adequate volume   Culture   Final    NO GROWTH < 24 HOURS Performed at [facility], [address] [place], [place] [address]    Report Status PENDING  Incomplete  Respiratory Panel by PCR     Status: None   Collection Time: 12/03/18  6:25 PM  Result Value Ref Range Status   Adenovirus NOT DETECTED NOT DETECTED Final   Coronavirus 229E NOT DETECTED NOT DETECTED Final   Coronavirus HKU1 NOT DETECTED NOT DETECTED Final   Coronavirus NL63 NOT DETECTED NOT DETECTED Final   Coronavirus OC43 NOT DETECTED NOT DETECTED Final   Metapneumovirus NOT DETECTED NOT DETECTED Final   Rhinovirus / Enterovirus NOT DETECTED NOT DETECTED Final   Influenza A NOT DETECTED NOT DETECTED Final   Influenza B NOT DETECTED NOT DETECTED Final   Parainfluenza Virus 1 NOT DETECTED NOT DETECTED Final   Parainfluenza Virus 2 NOT DETECTED NOT DETECTED Final   Parainfluenza Virus 3 NOT DETECTED NOT DETECTED Final   Parainfluenza Virus 4 NOT DETECTED NOT DETECTED Final   Respiratory Syncytial Virus NOT DETECTED NOT DETECTED Final   Bordetella pertussis NOT DETECTED NOT DETECTED Final  Chlamydophila pneumoniae NOT DETECTED NOT DETECTED Final   Mycoplasma pneumoniae NOT DETECTED NOT DETECTED Final    Comment: Performed at [facility], [address] [place], [place] [address]     Patient was seen and examined on the day of discharge and was found to be in stable condition. Time coordinating discharge: 25 minutes including assessment  and coordination of care, as well as examination of the patient.   SIGNED:  Noralee StainJennifer Aleyna Cueva, DO [facility] Pager [phone]  If 7PM-7AM, please contact night-coverage [email or web address] Password [email or web address] 12/05/2018, 10:23 AM

## 2018-12-05 NOTE — Care Management Note (Signed)
Case Management Note  Patient Details  Name: Devona KonigMichael Wormley MRN: [id number] Date of Birth: 31-Jul-1951  Subjective/Objective:                    Action/Plan: Pt discharged home with self care. Pt provided 30 day free Eliquis card. Pt's co pay after is $45/ month. Pt aware and feel this is do able. Wife providing transport home.   Expected Discharge Date:  12/05/18               Expected Discharge Plan:  Home/Self Care  In-House Referral:     Discharge planning Services  CM Consult, Medication Assistance  Post Acute Care Choice:    Choice offered to:     DME Arranged:    DME Agency:     HH Arranged:    HH Agency:     Status of Service:  Completed, signed off  If discussed at [employer] Length of Stay Meetings, dates discussed:    Additional Comments:  Kermit BaloKelli F Jefferey Lippmann, RN 12/05/2018, 1:07 PM

## 2018-12-06 LAB — FACTOR 5 LEIDEN

## 2018-12-08 LAB — CULTURE, BLOOD (ROUTINE X 2)
Culture: NO GROWTH
Culture: NO GROWTH
Special Requests: ADEQUATE
Special Requests: ADEQUATE

## 2018-12-09 LAB — PROTHROMBIN GENE MUTATION

## 2018-12-12 DIAGNOSIS — I480 Paroxysmal atrial fibrillation: Secondary | ICD-10-CM | POA: Diagnosis not present

## 2018-12-12 DIAGNOSIS — I2699 Other pulmonary embolism without acute cor pulmonale: Secondary | ICD-10-CM | POA: Diagnosis not present

## 2018-12-23 DIAGNOSIS — I499 Cardiac arrhythmia, unspecified: Secondary | ICD-10-CM | POA: Diagnosis not present

## 2018-12-31 ENCOUNTER — Encounter: Payer: Self-pay | Admitting: Cardiology

## 2018-12-31 DIAGNOSIS — R079 Chest pain, unspecified: Secondary | ICD-10-CM | POA: Insufficient documentation

## 2019-01-03 ENCOUNTER — Ambulatory Visit: Payer: BLUE CROSS/BLUE SHIELD | Admitting: Cardiology

## 2019-01-03 ENCOUNTER — Encounter: Payer: Self-pay | Admitting: Cardiology

## 2019-01-03 VITALS — BP 125/73 | HR 71 | Ht 78.0 in | Wt 231.2 lb

## 2019-01-03 DIAGNOSIS — R079 Chest pain, unspecified: Secondary | ICD-10-CM | POA: Diagnosis not present

## 2019-01-03 DIAGNOSIS — I2609 Other pulmonary embolism with acute cor pulmonale: Secondary | ICD-10-CM | POA: Diagnosis not present

## 2019-01-03 DIAGNOSIS — I2602 Saddle embolus of pulmonary artery with acute cor pulmonale: Secondary | ICD-10-CM | POA: Diagnosis not present

## 2019-01-03 DIAGNOSIS — I480 Paroxysmal atrial fibrillation: Secondary | ICD-10-CM

## 2019-01-03 DIAGNOSIS — I493 Ventricular premature depolarization: Secondary | ICD-10-CM

## 2019-01-03 MED ORDER — APIXABAN 5 MG PO TABS: 5.0000 mg | ORAL_TABLET | Freq: Two times a day (BID) | ORAL | 11 refills | Status: DC

## 2019-01-03 NOTE — Progress Notes (Signed)
PCP: Phillip Bal, PA-C  Clinic Note: Chief Complaint  Patient presents with  . Hospitalization Follow-up    Recently admitted for multiple bilateral PEs with cor pulmonale.  Marland Kitchen Shortness of Breath    Initial referral was for exertional dyspnea and exercise intolerance.    HPI: Phillip Dalton is a 68 y.o. male with a PMH below who presents today for initial hospital follow-up of multiple segmental pulmonary emboli- with acute Cor Pulmonale on Echo in December 2019. --He was initially scheduled to see me back in December, but this happened to be at the time that he was admitted for his PE.  The reason for referral was progressive exertional dyspnea. --He was seen by Phillip Carne, PA on December 9 noting poor exercise tolerance with decreased stamina and dyspnea over the last 3 weeks.  He was concerned little bit because he donates blood regularly, but it is last been 3 weeks ago.  He should have felt better since then.  He noted a hard time doing any cardio exercise with more dyspnea but no.  Chest pain (usually working out at the gym 3 days a week).  He was referred for cardiology evaluation.  Phillip Dalton was last seen in the hospital for inpatient consultation on December 02, 2018.  Prior to hospitalization he had been noticing exertional dyspnea over the last month or so.  Recent Hospitalizations:   Hospitalized 12/22-12/26/2019 for multiple PEs.  EKG noted A. fib with RVR.  CTA chest revealed severe occlusive pulmonary emboli involving proximal arteries on both sides.  Also distal pulmonary arteries with RV strain.  Was started on Eliquis for anticoagulation using loading dose for DVT PE.  A. fib was thought to be related to PE.  Was on oral diltiazem.  Plan was to follow-up echo in 1 month to reassess RV size and pressures/volume.  Studies Personally Reviewed - (if available, images/films reviewed: From Epic Chart or Care Everywhere)  Holter Monitor December 2019:  Sinus rhythm with PVCs burden of 5.5.  1 3 beat salvo.  Rare PACs.  Venous Doppler 12/02/2018: Acute DVT involving left peroneal vein.  Cystic structure found in right popliteal fossa.  TTE December 02, 2018 (in setting of PEs): Mild LVH.  Focal basal hypertrophy.  EF 55 to 60%.  Mild MR.  Severely dilated and hypokinetic RV with severely dilated RA.  Estimated PA pressure 42 mmHg.  No PFO.  Dilated IVC and blunted respirophasic change, consistent with elevated CVP.   Interval History: Phillip Dalton presents here today for hospital follow-up overall doing fairly well.  He says he is notably better as far as his breathing goes.  He is not yet gotten into his routine of exercise, but does note that walking around makes him less dyspneic than he had in the hospital.  He has not had any profound dyspnea or chest pain or pressure with rest or exertion.  No real edema.  No unilateral swelling despite the finding of DVT.  He is wearing his support stockings. No rapid irregular heartbeats palpitations.  No syncope/near syncope or TIA/amorous fugax. No bleeding issues on anticoagulation -- No melena, hematochezia, hematuria, or epstaxis. No claudication.  ROS: A comprehensive was performed. Review of Systems  Constitutional: Negative for chills, fever and malaise/fatigue (Energy level seems to be okay.).       Getting over a recent cold.  No fevers or chills   HENT: Positive for congestion.   Respiratory: Positive for cough and shortness of breath (Much better  over the last week).        Getting over a cold.  Gastrointestinal: Negative for constipation and heartburn.  Musculoskeletal: Negative for falls and joint pain.  Neurological: Negative for dizziness and headaches.  Endo/Heme/Allergies: Does not bruise/bleed easily.  Psychiatric/Behavioral: Negative for depression and memory loss. The patient does not have insomnia.   All other systems reviewed and are negative.  I have reviewed and (if needed)  personally updated the patient's problem list, medications, allergies, past medical and surgical history, social and family history.   Past Medical History:  Diagnosis Date  . Allergic rhinitis   . Nephrolithiasis   . Overweight   . PAF (paroxysmal atrial fibrillation) ([place]) 12/02/2018   Noted in the setting of what was likely acute on chronic PEs.  . Pulmonary embolus ([place]) 12/01/2018   With acute cor pulmonale: RV dilation with pressure and volume overload on echo.    Past Surgical History:  Procedure Laterality Date  . 24-HOUR HOLTER MONITOR  11/2018   Sinus rhythm with PVCs burden of 5.5.  1 3 beat salvo.  Rare PACs.  . LOWER EXTREMITY VENOUS DOPPLERS  12/02/2018   Acute DVT involving left peroneal vein.  Cystic structure found in right popliteal fossa.  . TRANSTHORACIC ECHOCARDIOGRAM  11/2018   In setting of acute PE:  Mild LVH.  Focal basal hypertrophy.  EF 55 to 60%.  Mild MR.  Severely dilated and hypokinetic RV with severely dilated RA.  Estimated PA pressure 42 mmHg.  No PFO.  Dilated IVC and blunted respirophasic change, consistent with elevated CVP. -->  Suggests acute cor pulmonale    Reportedly normal stress test in 2016 [facility]).   Current Meds  Medication Sig  . apixaban (ELIQUIS) 5 MG TABS tablet Take 1 tablet (5 mg total) by mouth 2 (two) times daily.  Marland Kitchen arginine 500 MG tablet Take 500 mg by mouth daily.  . Ascorbic Acid (VITAMIN C) 1000 MG tablet Take 1,000 mg by mouth daily.  Marland Kitchen b complex vitamins tablet Take 1 tablet by mouth daily.  . Chromium Picolinate 200 MCG TABS Take by mouth.  . Glucosamine 500 MG CAPS Take 1 capsule by mouth daily.  . Omega-3 Fatty Acids (FISH OIL) 1000 MG CAPS Take 1 capsule by mouth daily.  Marland Kitchen selenium 200 MCG TABS tablet Take 1 tablet by mouth daily.  . vitamin E 100 UNIT capsule Take 1 capsule by mouth daily.  . [DISCONTINUED] apixaban (ELIQUIS) 5 MG TABS tablet Take 76m (2 tablets) twice a day for 1 week (until 12/10/18)  then start 538m(1 tablet) twice a day starting 12/11/2018  . [DISCONTINUED] vitamin E 1000 UNIT capsule Take 1,000 Units by mouth daily.    No Known Allergies  Social History   Tobacco Use  . Smoking status: Never Smoker  . Smokeless tobacco: Never Used  Substance Use Topics  . Alcohol use: Yes    Comment: rare  . Drug use: Never   Social History   Social History Narrative   Lives with his wife.  Very active, usually exercises 3 days a week.    family history includes Anemia in his father; Asthma in his mother; Atrial fibrillation in an other family member; Hypertension in his mother.  Wt Readings from Last 3 Encounters:  01/03/19 231 lb 3.2 oz (104.9 kg)  12/01/18 224 lb (101.6 kg)    PHYSICAL EXAM BP 125/73   Pulse 71   Ht 6' 6"  (1.981 m)   Wt 231  lb 3.2 oz (104.9 kg)   BMI 26.72 kg/m  Physical Exam  Constitutional: He is oriented to person, place, and time. He appears well-developed and well-nourished. No distress.  Healthy-appearing.  Well-groomed  HENT:  Head: Normocephalic and atraumatic.  Neck: Normal range of motion. Neck supple. No hepatojugular reflux and no JVD present. Carotid bruit is not present.  Cardiovascular: Normal rate, regular rhythm, normal heart sounds, intact distal pulses and normal pulses.  Occasional extrasystoles are present. PMI is not displaced. Exam reveals no gallop and no friction rub.  No murmur heard. Pulmonary/Chest: Effort normal and breath sounds normal. No respiratory distress. He has no wheezes.  Abdominal: Soft. Bowel sounds are normal. He exhibits no distension. There is no abdominal tenderness. There is no rebound.  Musculoskeletal: Normal range of motion.        General: No edema.  Neurological: He is alert and oriented to person, place, and time.  Psychiatric: He has a normal mood and affect. His behavior is normal. Judgment and thought content normal.  Vitals reviewed.    Adult ECG Report  Rate: 71 ;  Rhythm: normal  sinus rhythm, premature ventricular contractions (PVC) and Otherwise normal axis, intervals and durations.;   Narrative Interpretation: Mostly normal EKG with PVC  From PCP 11/29/2018: Sinus rhythm with frequent multifocal PVCs.  Cannot exclude left atrial enlargement.,  "Poor R wave progression ". Second EKG just showed normal rhythm with "anteroseptal infarct, age undetermined.  Basically V2 did not have any voltage.  It was not atrial flutter despite the computer reading in his atrial flutter.   Other studies Reviewed: Additional studies/ records that were reviewed today include:  Recent Labs:   From PCP 11/18/2018: CBC-W7.9, H/H 15.5/46.0.  Platelet 209.  Sodium 141, potassium 4.3, chloride 104, bicarb 30.  BUN/creatinine 24/1.28.  Glucose 88.  Calcium 9.9.  Alk phos 49, AST 41,  ALT 19.  TC 233, TG 68, LDL 145, HDL 68.   ASSESSMENT / PLAN: Problem List Items Addressed This Visit    Chest pain    He has not had any further chest pain since his admission for PE.  At this point I wanted to see his heart recover from PE, and then if he still has some exertional dyspnea and intolerance, would probably then consider stress test evaluation.      Relevant Orders   ECHOCARDIOGRAM COMPLETE   PAF (paroxysmal atrial fibrillation) ([place])    He presented with acute rapid A. fib with his PE.  I suspect that this is probably related to his PE is not necessarily something that he will have going on in the future.  Was initially converted with diltiazem.  We started him on p.o. 120 mg XT diltiazem but he did not tolerate that and is now taking 90 mg of short acting diltiazem once daily and doing well. He felt really dizzy with the 120 mg.  Plan: Continue low-dose diltiazem for now as he has not had any breakthrough episodes. He is already on Eliquis for at least 2 years. --Would probably want to reevaluate to see if he has any recurrent A. fib, and if not probably would not need to continue beyond his  2 years for PE.  This patients CHA2DS2-VASc Score and unadjusted Ischemic Stroke Rate (% per year) is equal to 2.2 % stroke rate/year from a score of 2  Above score calculated as 1 point each if present [CHF, HTN, DM, Vascular=MI/PAD/Aortic Plaque, Age if 65-74, or Male] Above score calculated  as 2 points each if present [Age > 75, or Stroke/TIA/TE]        Relevant Medications   apixaban (ELIQUIS) 5 MG TABS tablet   diltiazem (CARDIZEM SR) 90 MG 12 hr capsule   Other Relevant Orders   ECHOCARDIOGRAM COMPLETE   Pulmonary embolism with acute cor pulmonale (HCC) ([place])    I actually suspect that his final presentation was because of a large PE that may have been even a potential saddle pulmonary embolus with bilateral PEs noted.  There were several small areas noted as well.  He may very well have been flicking clots from his leg DVT over the last several weeks prior to that presentation.  This could easily explain his exertional dyspnea. He does indicate that he had recently had an airplane trip which could explain his DVT.  Feeling much better on Eliquis now.  Has finished the high-dose load and is now on maintenance dose.  No bleeding issues.  I initially had thought about doing a follow-up echocardiogram in a month to reassess, but based on the RV dilation with pressure and volume overload, thinking went away at least 2 months and recheck an echo at the end of next month to see if it improves.  Based on the size and extent of PEs, would probably plan minimum 1 year if not at least 2 years of Eliquis.  For what appears to be acute pulmonary hypertension from his PEs, I chose to continue diltiazem for mild pulmonary vasodilation.      Relevant Medications   apixaban (ELIQUIS) 5 MG TABS tablet   diltiazem (CARDIZEM SR) 90 MG 12 hr capsule   Other Relevant Orders   ECHOCARDIOGRAM COMPLETE   Pulmonary embolus (HCC) - Primary   Relevant Medications   apixaban (ELIQUIS) 5 MG TABS  tablet   diltiazem (CARDIZEM SR) 90 MG 12 hr capsule   Other Relevant Orders   ECHOCARDIOGRAM COMPLETE   PVC's (premature ventricular contractions)    PVCs noted on EKG both by PCP and here.  This could very well be related to his RV dilation.  As long as her asymptomatic, would not treat beyond his low-dose diltiazem.      Relevant Medications   apixaban (ELIQUIS) 5 MG TABS tablet   diltiazem (CARDIZEM SR) 90 MG 12 hr capsule      I spent a total of 68mnutes with the patient and chart review. >  50% of the time was spent in direct patient consultation.   Current medicines are reviewed at length with the patient today.  (+/- concerns) n/a The following changes have been made:  n/a  Patient Instructions  Medication Instructions:   Not needed Continue with current medications  If you need a refill on your cardiac medications before your next appointment, please call your pharmacy.   Lab work: Not needed If you have labs (blood work) drawn today and your tests are completely normal, you will receive your results only by: .Marland KitchenMyChart Message (if you have MyChart) OR . A paper copy in the mail If you have any lab test that is abnormal or we need to change your treatment, we will call you to review the results.  Testing/Procedures: [address] the end of Feb 2020 Your physician has requested that you have an echocardiogram. Echocardiography is a painless test that uses sound waves to create images of your heart. It provides your doctor with information about the size and shape of your  heart and how well your heart's chambers and valves are working. This procedure takes approximately one hour. There are no restrictions for this procedure.    Follow-Up: At [facility], you and your health needs are our priority.  As part of our continuing mission to provide you with exceptional heart care, we have created designated Provider Care Teams.  These  Care Teams include your primary Cardiologist (physician) and Advanced Practice Providers (APPs -  Physician Assistants and Nurse Practitioners) who all work together to provide you with the care you need, when you need it. You will need a follow up appointment in 4 months June 2020.  Please call our office 2 months in advance to schedule this appointment.  You may see DR Ellyn Hack or one of the following Advanced Practice Providers on your designated Care Team:   Rosaria Ferries, PA-C . Jory Sims, DNP, ANP  Any Other Special Instructions Will Be Listed Below (If Applicable).      Studies Ordered:   Orders Placed This Encounter  Procedures  . ECHOCARDIOGRAM COMPLETE      Glenetta Hew, M.D., M.S. Interventional Cardiologist   Pager # [phone] Phone # [phone] [address] [place], [place] [address]   Thank you for choosing Heartcare at [facility]!!

## 2019-01-03 NOTE — Patient Instructions (Addendum)
Medication Instructions:   Not needed Continue with current medications  If you need a refill on your cardiac medications before your next appointment, please call your pharmacy.   Lab work: Not needed If you have labs (blood work) drawn today and your tests are completely normal, you will receive your results only by: Marland Kitchen MyChart Message (if you have MyChart) OR . A paper copy in the mail If you have any lab test that is abnormal or we need to change your treatment, we will call you to review the results.  Testing/Procedures: Schedule at [address] the end of Feb 2020 Your physician has requested that you have an echocardiogram. Echocardiography is a painless test that uses sound waves to create images of your heart. It provides your doctor with information about the size and shape of your heart and how well your heart's chambers and valves are working. This procedure takes approximately one hour. There are no restrictions for this procedure.    Follow-Up: At [facility], you and your health needs are our priority.  As part of our continuing mission to provide you with exceptional heart care, we have created designated Provider Care Teams.  These Care Teams include your primary Cardiologist (physician) and Advanced Practice Providers (APPs -  Physician Assistants and Nurse Practitioners) who all work together to provide you with the care you need, when you need it. You will need a follow up appointment in 4 months June 2020.  Please call our office 2 months in advance to schedule this appointment.  You may see DR Herbie Baltimore or one of the following Advanced Practice Providers on your designated Care Team:   Theodore Demark, PA-C . Joni Reining, DNP, ANP  Any Other Special Instructions Will Be Listed Below (If Applicable).

## 2019-01-05 ENCOUNTER — Encounter: Payer: Self-pay | Admitting: Cardiology

## 2019-01-05 DIAGNOSIS — I493 Ventricular premature depolarization: Secondary | ICD-10-CM | POA: Insufficient documentation

## 2019-01-05 NOTE — Assessment & Plan Note (Signed)
He presented with acute rapid A. fib with his PE.  I suspect that this is probably related to his PE is not necessarily something that he will have going on in the future.  Was initially converted with diltiazem.  We started him on p.o. 120 mg XT diltiazem but he did not tolerate that and is now taking 90 mg of short acting diltiazem once daily and doing well. He felt really dizzy with the 120 mg.  Plan: Continue low-dose diltiazem for now as he has not had any breakthrough episodes. He is already on Eliquis for at least 2 years. --Would probably want to reevaluate to see if he has any recurrent A. fib, and if not probably would not need to continue beyond his 2 years for PE.  This patients CHA2DS2-VASc Score and unadjusted Ischemic Stroke Rate (% per year) is equal to 2.2 % stroke rate/year from a score of 2  Above score calculated as 1 point each if present [CHF, HTN, DM, Vascular=MI/PAD/Aortic Plaque, Age if 65-74, or Male] Above score calculated as 2 points each if present [Age > 75, or Stroke/TIA/TE]

## 2019-01-05 NOTE — Assessment & Plan Note (Signed)
PVCs noted on EKG both by PCP and here.  This could very well be related to his RV dilation.  As long as her asymptomatic, would not treat beyond his low-dose diltiazem.

## 2019-01-05 NOTE — Assessment & Plan Note (Addendum)
I actually suspect that his final presentation was because of a large PE that may have been even a potential saddle pulmonary embolus with bilateral PEs noted.  There were several small areas noted as well.  He may very well have been flicking clots from his leg DVT over the last several weeks prior to that presentation.  This could easily explain his exertional dyspnea. He does indicate that he had recently had an airplane trip which could explain his DVT.  Feeling much better on Eliquis now.  Has finished the high-dose load and is now on maintenance dose.  No bleeding issues.  I initially had thought about doing a follow-up echocardiogram in a month to reassess, but based on the RV dilation with pressure and volume overload, thinking went away at least 2 months and recheck an echo at the end of next month to see if it improves.  Based on the size and extent of PEs, would probably plan minimum 1 year if not at least 2 years of Eliquis.  For what appears to be acute pulmonary hypertension from his PEs, I chose to continue diltiazem for mild pulmonary vasodilation.

## 2019-01-05 NOTE — Assessment & Plan Note (Signed)
He has not had any further chest pain since his admission for PE.  At this point I wanted to see his heart recover from PE, and then if he still has some exertional dyspnea and intolerance, would probably then consider stress test evaluation.

## 2019-01-14 NOTE — Addendum Note (Signed)
Addended by: Chana Bode on: 01/14/2019 10:18 AM   Modules accepted: Orders

## 2019-02-03 ENCOUNTER — Ambulatory Visit (HOSPITAL_COMMUNITY): Payer: BLUE CROSS/BLUE SHIELD | Attending: Cardiology

## 2019-02-03 DIAGNOSIS — I2609 Other pulmonary embolism with acute cor pulmonale: Secondary | ICD-10-CM | POA: Insufficient documentation

## 2019-02-03 DIAGNOSIS — I480 Paroxysmal atrial fibrillation: Secondary | ICD-10-CM | POA: Diagnosis not present

## 2019-02-03 DIAGNOSIS — R079 Chest pain, unspecified: Secondary | ICD-10-CM | POA: Diagnosis not present

## 2019-02-03 HISTORY — PX: TRANSTHORACIC ECHOCARDIOGRAM: SHX275

## 2019-02-11 DIAGNOSIS — Z7901 Long term (current) use of anticoagulants: Secondary | ICD-10-CM | POA: Diagnosis not present

## 2019-02-11 DIAGNOSIS — I10 Essential (primary) hypertension: Secondary | ICD-10-CM | POA: Diagnosis not present

## 2019-02-11 DIAGNOSIS — Z86711 Personal history of pulmonary embolism: Secondary | ICD-10-CM | POA: Diagnosis not present

## 2019-02-14 ENCOUNTER — Telehealth: Payer: Self-pay | Admitting: Cardiology

## 2019-02-14 NOTE — Telephone Encounter (Signed)
   Primary Cardiologist: Bryan Lemma, MD  Chart reviewed as part of pre-operative protocol coverage. Simple dental extractions are considered low risk procedures per guidelines and generally do not require any specific cardiac clearance. It is also generally accepted that for simple extractions and dental cleanings, there is no need to interrupt blood thinner therapy. Also pt is on blood thinners for treatment of PE. Do not hold Eliquis for dental procedure.   SBE prophylaxis is not  required for the patient.  Regarding use of epinephrine, Per Dr. Herbie Baltimore, primary cardiologist, " I think for local anesthetic epi should be okay as long as it is not injected into the bloodstream.  Would want him to to monitor closely for signs of recurrent A. fib, but not very likely with local anesthesia".  Bryan Lemma, MD  I will route this recommendation to the requesting party via Epic fax function and remove from pre-op pool.  Please call with questions.  Robbie Lis, PA-C 02/14/2019, 12:13 PM

## 2019-02-14 NOTE — Telephone Encounter (Signed)
1. What dental office are you calling from? [place] [facility]  2. What is your office phone number? [phone]  3. What is your fax number?[phone]  4. What type of procedure is the patient having performed?  C[place]   5. What date is procedure scheduled or is the patient there now? Waiting on Clearance in order to schedule  (if the patient is at the dentist's office question goes to their cardiologist if he/she is in the office.  If not, question should go to the DOD).   6. What is your question (ex. Antibiotics prior to procedure, holding medication-we need to know how long dentist wants pt to hold med)? Does he need to take premedicate antibiotic and can he have Epenephrine

## 2019-02-14 NOTE — Telephone Encounter (Signed)
Difficult question.  I think for local anesthetic epi should be okay as long as it is not injected into the bloodstream.  Would want him to to monitor closely for signs of recurrent A. fib, but not very likely with local anesthesia.  Bryan Lemma, MD

## 2019-04-17 DIAGNOSIS — Z20828 Contact with and (suspected) exposure to other viral communicable diseases: Secondary | ICD-10-CM | POA: Diagnosis not present

## 2019-05-13 ENCOUNTER — Telehealth: Payer: Self-pay

## 2019-05-13 NOTE — Telephone Encounter (Signed)
Virtual Visit Pre-Appointment Phone Call  "(Name), I am calling you today to discuss your upcoming appointment. We are currently trying to limit exposure to the virus that causes COVID-19 by seeing patients at home rather than in the office."  1. "What is the BEST phone number to call the day of the visit?" - [phone]  2. "Do you have or have access to (through a family member/friend) a smartphone with video capability that we can use for your visit?" a. If yes - list this number in appt notes as "cell" (if different from BEST phone #) and list the appointment type as a VIDEO visit in appointment notes b. If no - list the appointment type as a PHONE visit in appointment notes  3. Confirm consent - "In the setting of the current Covid19 crisis, you are scheduled for a (phone or video) visit with your provider on (date) at (time).  Just as we do with many in-office visits, in order for you to participate in this visit, we must obtain consent.  If you'd like, I can send this to your mychart (if signed up) or email for you to review.  Otherwise, I can obtain your verbal consent now.  All virtual visits are billed to your insurance company just like a normal visit would be.  By agreeing to a virtual visit, we'd like you to understand that the technology does not allow for your provider to perform an examination, and thus may limit your provider's ability to fully assess your condition. If your provider identifies any concerns that need to be evaluated in person, we will make arrangements to do so.  Finally, though the technology is pretty good, we cannot assure that it will always work on either your or our end, and in the setting of a video visit, we may have to convert it to a phone-only visit.  In either situation, we cannot ensure that we have a secure connection.  Are you willing to proceed?" STAFF: Did the patient verbally acknowledge consent to telehealth visit? Document YES/NO here: Pt  provided verbal consent  4. Advise patient to be prepared - "Two hours prior to your appointment, go ahead and check your blood pressure, pulse, oxygen saturation, and your weight (if you have the equipment to check those) and write them all down. When your visit starts, your provider will ask you for this information. If you have an Apple Watch or Kardia device, please plan to have heart rate information ready on the day of your appointment. Please have a pen and paper handy nearby the day of the visit as well."  5. Give patient instructions for MyChart download to smartphone OR Doximity/Doxy.me as below if video visit (depending on what platform provider is using)  6. Inform patient they will receive a phone call 15 minutes prior to their appointment time (may be from unknown caller ID) so they should be prepared to answer    TELEPHONE CALL NOTE  Phillip Dalton has been deemed a candidate for a follow-up tele-health visit to limit community exposure during the Covid-19 pandemic. I spoke with the patient via phone to ensure availability of phone/video source, confirm preferred email & phone number, and discuss instructions and expectations.  I reminded Phillip Dalton to be prepared with any vital sign and/or heart rhythm information that could potentially be obtained via home monitoring, at the time of his visit. I reminded Phillip Dalton to expect a phone call prior to his visit.  Alphonsus Siasasneya  A Matalyn Nawaz, CMA 05/13/2019 1:46 PM   INSTRUCTIONS FOR DOWNLOADING THE MYCHART APP TO SMARTPHONE  - The patient must first make sure to have activated MyChart and know their login information - If Apple, go to [employer] and type in MyChart in the search bar and download the app. If Android, ask patient to go to [employer] and type in [place] in the search bar and download the app. The app is free but as with any other app downloads, their phone may require them to verify saved payment  information or Apple/Android password.  - The patient will need to then log into the app with their MyChart username and password, and select [facility] as their healthcare provider to link the account. When it is time for your visit, go to the MyChart app, find appointments, and click Begin Video Visit. Be sure to Select Allow for your device to access the Microphone and Camera for your visit. You will then be connected, and your provider will be with you shortly.  **If they have any issues connecting, or need assistance please contact MyChart service desk [phone])**  **If using a computer, in order to ensure the best quality for their visit they will need to use either of the following Internet Browsers: [employer] or Google Chrome**  IF USING DOXIMITY or DOXY.ME - The patient will receive a link just prior to their visit by text.     FULL LENGTH CONSENT FOR TELE-HEALTH VISIT   I hereby voluntarily request, consent and authorize [facility] and its employed or contracted physicians, physician assistants, nurse practitioners or other licensed health care professionals (the Practitioner), to provide me with telemedicine health care services (the "Services") as deemed necessary by the treating Practitioner. I acknowledge and consent to receive the Services by the Practitioner via telemedicine. I understand that the telemedicine visit will involve communicating with the Practitioner through live audiovisual communication technology and the disclosure of certain medical information by electronic transmission. I acknowledge that I have been given the opportunity to request an in-person assessment or other available alternative prior to the telemedicine visit and am voluntarily participating in the telemedicine visit.  I understand that I have the right to withhold or withdraw my consent to the use of telemedicine in the course of my care at any time, without affecting my right  to future care or treatment, and that the Practitioner or I may terminate the telemedicine visit at any time. I understand that I have the right to inspect all information obtained and/or recorded in the course of the telemedicine visit and may receive copies of available information for a reasonable fee.  I understand that some of the potential risks of receiving the Services via telemedicine include:  Marland Kitchen Delay or interruption in medical evaluation due to technological equipment failure or disruption; . Information transmitted may not be sufficient (e.g. poor resolution of images) to allow for appropriate medical decision making by the Practitioner; and/or  . In rare instances, security protocols could fail, causing a breach of personal health information.  Furthermore, I acknowledge that it is my responsibility to provide information about my medical history, conditions and care that is complete and accurate to the best of my ability. I acknowledge that Practitioner's advice, recommendations, and/or decision may be based on factors not within their control, such as incomplete or inaccurate data provided by me or distortions of diagnostic images or specimens that may result from electronic transmissions. I understand that the  practice of medicine is not an Visual merchandiser and that Practitioner makes no warranties or guarantees regarding treatment outcomes. I acknowledge that I will receive a copy of this consent concurrently upon execution via email to the email address I last provided but may also request a printed copy by calling the office of [facility].    I understand that my insurance will be billed for this visit.   I have read or had this consent read to me. . I understand the contents of this consent, which adequately explains the benefits and risks of the Services being provided via telemedicine.  . I have been provided ample opportunity to ask questions regarding this consent and the Services  and have had my questions answered to my satisfaction. . I give my informed consent for the services to be provided through the use of telemedicine in my medical care  By participating in this telemedicine visit I agree to the above.

## 2019-05-16 ENCOUNTER — Telehealth: Payer: Self-pay | Admitting: Cardiology

## 2019-05-16 NOTE — Telephone Encounter (Signed)
smartphone/ consent/ my chart via emailed/ pre reg completed  °

## 2019-05-20 DIAGNOSIS — I10 Essential (primary) hypertension: Secondary | ICD-10-CM | POA: Diagnosis not present

## 2019-05-20 DIAGNOSIS — Z86711 Personal history of pulmonary embolism: Secondary | ICD-10-CM | POA: Diagnosis not present

## 2019-05-20 DIAGNOSIS — Z7901 Long term (current) use of anticoagulants: Secondary | ICD-10-CM | POA: Diagnosis not present

## 2019-05-22 ENCOUNTER — Other Ambulatory Visit: Payer: Self-pay

## 2019-05-22 ENCOUNTER — Telehealth (INDEPENDENT_AMBULATORY_CARE_PROVIDER_SITE_OTHER): Payer: BLUE CROSS/BLUE SHIELD | Admitting: Cardiology

## 2019-05-22 ENCOUNTER — Encounter: Payer: Self-pay | Admitting: Cardiology

## 2019-05-22 VITALS — Ht 78.0 in | Wt 223.0 lb

## 2019-05-22 DIAGNOSIS — I2782 Chronic pulmonary embolism: Secondary | ICD-10-CM

## 2019-05-22 DIAGNOSIS — I2609 Other pulmonary embolism with acute cor pulmonale: Secondary | ICD-10-CM | POA: Diagnosis not present

## 2019-05-22 DIAGNOSIS — I480 Paroxysmal atrial fibrillation: Secondary | ICD-10-CM

## 2019-05-22 NOTE — Patient Instructions (Addendum)
Medication Instructions:   No change-Continue both current dose of diltiazem and Eliquis for now.  If you need a refill on your cardiac medications before your next appointment, please call your pharmacy.    Lab work: -- will ask your PCP check - HYPERCOAGULATION Labs after you see her -- you would need to hold Eliquis x 1 week prior to labs & restart after.   Testing/Procedures: Not needed   Follow-Up: At [facility], you and your health needs are our priority.  As part of our continuing mission to provide you with exceptional heart care, we have created designated Provider Care Teams.  These Care Teams include your primary Cardiologist (physician) and Advanced Practice Providers (APPs -  Physician Assistants and Nurse Practitioners) who all work together to provide you with the care you need, when you need it. . You will need a follow up appointment in  7 months jan 2021 .  Please call our office 2 months in advance to schedule this appointment.  You may see Glenetta Hew, MD or one of the following Advanced Practice Providers on your designated Care Team:   . Rosaria Ferries, PA-C . Jory Sims, DNP, ANP  Any Other Special Instructions Will Be Listed Below (If Applicable).  Continue both current dose of diltiazem and Eliquis for now.  -When you see your primary provider in 6 months, I will asked that she temporarily hold your Eliquis for a week, then check blood work for "hypercoagulable state "(a condition that would make you likely to form clots).  Once you have had the blood work done, you would go back on Eliquis until we hear the results.  I will then see you back roughly 1 month after you see your PCP.

## 2019-05-22 NOTE — Progress Notes (Signed)
Virtual Visit via Video Note   This visit type was conducted due to national recommendations for restrictions regarding the COVID-19 Pandemic (e.g. social distancing) in an effort to limit this patient's exposure and mitigate transmission in our community.  Due to his co-morbid illnesses, this patient is at least at moderate risk for complications without adequate follow up.  This format is felt to be most appropriate for this patient at this time.  All issues noted in this document were discussed and addressed.  A limited physical exam was performed with this format.  Please refer to the patient's chart for his consent to [facility].   Patient has given verbal permission to conduct this visit via virtual appointment and to bill insurance 05/23/2019 12:12 AM     Evaluation Performed:  Follow-up visit  Date:  05/23/2019   ID:  Devona KonigMichael Borawski, DOB August 21, 1951, MRN [id number]  Patient Location: Home Provider Location: Home  PCP:  Heide ScalesNelson, Kristen M, PA-C  Cardiologist:  Bryan Lemmaavid Eliot Popper, MD  Electrophysiologist:  None   Chief Complaint:  5-6 month f/u - Afib & h/o PE with Cor Pulmonale   History of Present Illness:    Devona KonigMichael Vorce is a 68 y.o. male with PMH notable for PAF (in setting of PE - Dec 2019) who presents via Web designeraudio/video conferencing for a telehealth visit today.  Devona KonigMichael Motsinger was last seen in January of this year.  He was doing very well for his first hospital follow-up.  Notably improved breathing.  Not yet back to his full level activity.  No sensation of irregular heartbeats.  No bleeding issues on anticoagulation.  Was wearing support stockings and no unilateral swelling.  Energy level improving. --> Recheck 2D echo --> Reduce diltiazem dose to 90 mg short acting daily.,   --> Continued Eliquis --> CHA2DS2Vasc = 2. (Age & HTN)   Interval History:  Devona KonigMichael Fana is doing quite well.  Now very active.   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   No CV complaints.  Cardiovascular ROS: no chest pain or dyspnea on exertion negative for - edema, irregular heartbeat, loss of consciousness, orthopnea, palpitations, paroxysmal nocturnal dyspnea, rapid heart rate, shortness of breath or TIA/amaurosis fugax.  Near syncope  The patient does not have symptoms concerning for COVID-19 infection (fever, chills, cough, or new shortness of breath).   Tested for COVID 3 weeks ago - Negative (family member lives in [place] & was + - had a mild case). The patient is practicing social distancing.  ROS:  Please see the history of present illness.    Review of Systems  Constitutional: Negative for malaise/fatigue.  HENT: Negative for nosebleeds.   Respiratory: Negative for cough and shortness of breath.   Gastrointestinal: Negative for diarrhea, heartburn, nausea and vomiting.  Genitourinary: Negative for hematuria.  Neurological: Negative for dizziness and headaches.  Psychiatric/Behavioral: Negative.     Past Medical History:  Diagnosis Date  . Allergic rhinitis   . Nephrolithiasis   . Overweight   . PAF (paroxysmal atrial fibrillation) (HCC) 12/02/2018   Noted in the setting of what was likely acute on chronic PEs.  . Pulmonary embolus (HCC) 12/01/2018   With acute cor pulmonale: RV dilation with pressure and volume overload on echo.   Past Surgical History:  Procedure Laterality Date  . 24-HOUR HOLTER MONITOR  11/2018   Sinus rhythm with PVCs burden of 5.5.  1 3 beat  salvo.  Rare PACs.  . LOWER EXTREMITY VENOUS DOPPLERS  12/02/2018   Acute DVT involving left peroneal vein.  Cystic structure found in right popliteal fossa.  . TRANSTHORACIC ECHOCARDIOGRAM  11/2018   In setting of acute PE:  Mild LVH.  Focal basal hypertrophy.  EF 55 to 60%.  Mild MR.  Severely dilated and hypokinetic RV with severely dilated RA.  Estimated PA pressure 42 mmHg.  No PFO.  Dilated IVC and blunted  respirophasic change, consistent with elevated CVP. -->  Suggests acute cor pulmonale  . TRANSTHORACIC ECHOCARDIOGRAM  February 04, 2028   EF 60-65%.  Mild LVH.  GR 1 DD.  RV normal size and function.  Mild thickening of aortic valve but no stenosis with significant sclerosis.  Mildly dilated aortic root.     Current Meds  Medication Sig  . apixaban (ELIQUIS) 5 MG TABS tablet Take 1 tablet (5 mg total) by mouth 2 (two) times daily.  Marland Kitchen. arginine 500 MG tablet Take 500 mg by mouth daily.  . Ascorbic Acid (VITAMIN C) 1000 MG tablet Take 1,000 mg by mouth daily.  Marland Kitchen. b complex vitamins tablet Take 1 tablet by mouth daily.  . Chromium Picolinate 200 MCG TABS Take by mouth.  . diltiazem (CARDIZEM SR) 90 MG 12 hr capsule Take 90 mg by mouth daily.  . Glucosamine 500 MG CAPS Take 1 capsule by mouth daily.  . Omega-3 Fatty Acids (FISH OIL) 1000 MG CAPS Take 1 capsule by mouth daily.  Marland Kitchen. selenium 200 MCG TABS tablet Take 1 tablet by mouth daily.  . vitamin E 100 UNIT capsule Take 1 capsule by mouth daily.     Allergies:   Patient has no known allergies.   Social History   Tobacco Use  . Smoking status: Never Smoker  . Smokeless tobacco: Never Used  Substance Use Topics  . Alcohol use: Yes    Comment: rare  . Drug use: Never     Family Hx: The patient's family history includes Anemia in his father; Asthma in his mother; Atrial fibrillation in an other family member; Hypertension in his mother.   Prior CV studies:   The following studies were reviewed today: PSH updated . TTE February 03, 2019: EF 60-65%.  Mild LVH.  GR 1 DD.  RV normal size and function.  Mild thickening of aortic valve but no stenosis with significant sclerosis.  Mildly dilated aortic root.  Labs/Other Tests and Data Reviewed:    EKG:  No ECG reviewed.  Recent Labs: 12/01/2018: ALT 27; Magnesium 2.2 12/03/2018: TSH 2.446 12/05/2018: BUN 12; Creatinine, Ser 1.24; Hemoglobin 13.5; Platelets 292; Potassium 4.3; Sodium  137   Recent Lipid Panel No results found for: CHOL, TRIG, HDL, CHOLHDL, LDLCALC, LDLDIRECT  Wt Readings from Last 3 Encounters:  05/22/19 223 lb (101.2 kg)  01/03/19 231 lb 3.2 oz (104.9 kg)  12/01/18 224 lb (101.6 kg)     Objective:    Vital Signs:  Ht 6\' 6"  (1.981 m)   Wt 223 lb (101.2 kg)   BMI 25.77 kg/m   BP OK @ PCP  VITAL SIGNS:  reviewed GEN:  no acute distress RESPIRATORY:  normal respiratory effort, symmetric expansion CARDIOVASCULAR:  no peripheral edema NEURO:  alert and oriented x 3, no obvious focal deficit PSYCH:  normal affect  ASSESSMENT & PLAN:    Problem List Items Addressed This Visit    PAF (paroxysmal atrial fibrillation) (HCC) (Chronic)    Currently he is on Eliquis because  the PE.  He is only on minimal diltiazem short acting 90 mg daily and doing well with no evidence of recurrence.  I suspect that this episode of A. fib was in the setting of PE and probably did not recur.  If there is no recurrence over this year, my suggestion would be that it likely is not going to recur and decisions about anticoagulation to be made based on concerns about recurrent PE and not because of A. fib.      Pulmonary embolism with acute cor pulmonale (HCC) - Primary (Chronic)    Recovering remarkably well following his PEs.  Breathing is essentially back to baseline.  Echocardiogram actually showed near complete resolution of acute cor pulmonale.  He remains on Eliquis which would continue for least 1 year.  At that time I would suggest that PCP consider checking hypercoagulable state after stopping Eliquis for roughly 1 week and then restart until labs come back.  It is likely that the PEs were potentially related to his travel, if so then hypercoagulable state is probably negative.  The concerning feature is that there were multiple pulmonary emboli.         COVID-19 Education: The signs and symptoms of COVID-19 were discussed with the patient and how to seek care for  testing (follow up with PCP or arrange E-visit).   The importance of social distancing was discussed today.  Time:   Today, I have spent 15 minutes with the patient with telehealth technology discussing the above problems.     Medication Adjustments/Labs and Tests Ordered: Current medicines are reviewed at length with the patient today.  Concerns regarding medicines are outlined above.  Medication Instructions:   Continue both current dose of diltiazem and Eliquis for now.  -When you see your primary provider in 6 months, I will asked that she temporarily hold your Eliquis for a week, then check blood work for "hypercoagulable state "(a condition that would make you likely to form clots).  Once you have had the blood work done, you would go back on Eliquis until we hear the results.  I will then see you back roughly 1 month after you see your PCP.  Tests Ordered: No orders of the defined types were placed in this encounter.   Medication Changes: No orders of the defined types were placed in this encounter.   Disposition:  Follow up ~ 7 months     Signed, Glenetta Hew, MD  05/23/2019 12:12 AM    [place]

## 2019-05-23 ENCOUNTER — Encounter: Payer: Self-pay | Admitting: Cardiology

## 2019-05-23 NOTE — Assessment & Plan Note (Signed)
Recovering remarkably well following his PEs.  Breathing is essentially back to baseline.  Echocardiogram actually showed near complete resolution of acute cor pulmonale.  He remains on Eliquis which would continue for least 1 year.  At that time I would suggest that PCP consider checking hypercoagulable state after stopping Eliquis for roughly 1 week and then restart until labs come back.  It is likely that the PEs were potentially related to his travel, if so then hypercoagulable state is probably negative.  The concerning feature is that there were multiple pulmonary emboli.

## 2019-05-23 NOTE — Assessment & Plan Note (Signed)
Currently he is on Eliquis because the PE.  He is only on minimal diltiazem short acting 90 mg daily and doing well with no evidence of recurrence.  I suspect that this episode of A. fib was in the setting of PE and probably did not recur.  If there is no recurrence over this year, my suggestion would be that it likely is not going to recur and decisions about anticoagulation to be made based on concerns about recurrent PE and not because of A. fib.

## 2019-10-08 DIAGNOSIS — Z23 Encounter for immunization: Secondary | ICD-10-CM | POA: Diagnosis not present

## 2019-10-20 DIAGNOSIS — D1801 Hemangioma of skin and subcutaneous tissue: Secondary | ICD-10-CM | POA: Diagnosis not present

## 2019-10-20 DIAGNOSIS — L814 Other melanin hyperpigmentation: Secondary | ICD-10-CM | POA: Diagnosis not present

## 2019-10-20 DIAGNOSIS — C4441 Basal cell carcinoma of skin of scalp and neck: Secondary | ICD-10-CM | POA: Diagnosis not present

## 2019-10-20 DIAGNOSIS — D225 Melanocytic nevi of trunk: Secondary | ICD-10-CM | POA: Diagnosis not present

## 2019-10-20 DIAGNOSIS — D485 Neoplasm of uncertain behavior of skin: Secondary | ICD-10-CM | POA: Diagnosis not present

## 2019-10-20 DIAGNOSIS — Z85828 Personal history of other malignant neoplasm of skin: Secondary | ICD-10-CM | POA: Diagnosis not present

## 2019-12-09 ENCOUNTER — Ambulatory Visit: Payer: BC Managed Care – PPO | Attending: Internal Medicine

## 2019-12-09 ENCOUNTER — Other Ambulatory Visit: Payer: Self-pay

## 2019-12-09 DIAGNOSIS — Z20822 Contact with and (suspected) exposure to covid-19: Secondary | ICD-10-CM

## 2019-12-09 DIAGNOSIS — Z20828 Contact with and (suspected) exposure to other viral communicable diseases: Secondary | ICD-10-CM | POA: Diagnosis not present

## 2019-12-10 DIAGNOSIS — D485 Neoplasm of uncertain behavior of skin: Secondary | ICD-10-CM | POA: Diagnosis not present

## 2019-12-10 DIAGNOSIS — C44319 Basal cell carcinoma of skin of other parts of face: Secondary | ICD-10-CM | POA: Diagnosis not present

## 2019-12-10 LAB — NOVEL CORONAVIRUS, NAA: SARS-CoV-2, NAA: NOT DETECTED

## 2019-12-24 DIAGNOSIS — C44319 Basal cell carcinoma of skin of other parts of face: Secondary | ICD-10-CM | POA: Diagnosis not present

## 2019-12-26 DIAGNOSIS — Z125 Encounter for screening for malignant neoplasm of prostate: Secondary | ICD-10-CM | POA: Diagnosis not present

## 2019-12-26 DIAGNOSIS — Z0000 Encounter for general adult medical examination without abnormal findings: Secondary | ICD-10-CM | POA: Diagnosis not present

## 2019-12-26 DIAGNOSIS — I10 Essential (primary) hypertension: Secondary | ICD-10-CM | POA: Diagnosis not present

## 2019-12-26 DIAGNOSIS — Z23 Encounter for immunization: Secondary | ICD-10-CM | POA: Diagnosis not present

## 2019-12-26 DIAGNOSIS — Z13220 Encounter for screening for lipoid disorders: Secondary | ICD-10-CM | POA: Diagnosis not present

## 2020-01-07 ENCOUNTER — Ambulatory Visit: Payer: BC Managed Care – PPO

## 2020-01-15 ENCOUNTER — Other Ambulatory Visit: Payer: Self-pay | Admitting: Cardiology

## 2020-01-15 ENCOUNTER — Ambulatory Visit: Payer: BC Managed Care – PPO | Attending: Internal Medicine

## 2020-01-15 DIAGNOSIS — Z23 Encounter for immunization: Secondary | ICD-10-CM | POA: Insufficient documentation

## 2020-01-15 NOTE — Progress Notes (Signed)
   Covid-19 Vaccination Clinic  Name:  Phillip Dalton    MRN: [id number] DOB: May 22, 1951  01/15/2020  [name] was observed post Covid-19 immunization for 15 minutes without incidence. He was provided with Vaccine Information Sheet and instruction to access the V-Safe system.   [name] was instructed to call 911 with any severe reactions post vaccine: Marland Kitchen Difficulty breathing  . Swelling of your face and throat  . A fast heartbeat  . A bad rash all over your body  . Dizziness and weakness    Immunizations Administered    Name Date Dose VIS Date Route   Pfizer COVID-19 Vaccine 01/15/2020  9:19 AM 0.3 mL 11/21/2019 Intramuscular   Manufacturer: [employer], [employer]   Lot: [id number]   NDC: [address]-1

## 2020-02-09 ENCOUNTER — Ambulatory Visit: Payer: BC Managed Care – PPO | Attending: Internal Medicine

## 2020-02-09 DIAGNOSIS — Z23 Encounter for immunization: Secondary | ICD-10-CM | POA: Insufficient documentation

## 2020-02-09 NOTE — Progress Notes (Signed)
   Covid-19 Vaccination Clinic  Name:  Phillip Dalton    MRN: [id number] DOB: 09/20/1951  02/09/2020  Mr. Phillip Dalton was observed post Covid-19 immunization for 15 minutes without incidence. He was provided with Vaccine Information Sheet and instruction to access the V-Safe system.   Mr. Phillip Dalton was instructed to call 911 with any severe reactions post vaccine: Marland Kitchen Difficulty breathing  . Swelling of your face and throat  . A fast heartbeat  . A bad rash all over your body  . Dizziness and weakness    Immunizations Administered    Name Date Dose VIS Date Route   Pfizer COVID-19 Vaccine 02/09/2020  1:58 PM 0.3 mL 11/21/2019 Intramuscular   Manufacturer: [employer], [employer]   Lot: [id number]   NDC: [address]-8

## 2020-02-11 NOTE — Progress Notes (Signed)
Virtual Visit via Video Note   This visit type was conducted due to national recommendations for restrictions regarding the COVID-19 Pandemic (e.g. social distancing) in an effort to limit this patient's exposure and mitigate transmission in our community.  Due to his co-morbid illnesses, this patient is at least at moderate risk for complications without adequate follow up.  This format is felt to be most appropriate for this patient at this time.  All issues noted in this document were discussed and addressed.  A limited physical exam was performed with this format.  Please refer to the patient's chart for his consent to telehealth for [facility].   Date:  02/11/2020   ID:  Phillip Dalton, DOB 08-22-1951, MRN [id number]  Patient Location: Home Provider Location: Home  PCP:  Aurea Graff, PA-C  Cardiologist:  Glenetta Hew, MD  Electrophysiologist:  None   Evaluation Performed:  Follow-Up Visit  Chief Complaint:  F/U PE   History of Present Illness:    Phillip Dalton is a 69 y.o. male we are following for ongoing assessment and management of PAF (in the setting of PE in December 2019), last seen by Dr. Ellyn Hack on 05/22/2019.  He was continued on Eliquis along with short acting diltiazem 90 mg daily.  He was felt that the atrial fibrillation was related to PE and therefore he was advised to stop diltiazem.  He was to be seen on follow-up to discuss need to continue anticoagulation.  His breathing status was normal.  It was felt that his PEs were potentially related to travel.  He is without complaints today concerning dyspnea, chest pain, or fatigue.  He is ready to get back to the gym. He has had both COVID vaccines and has done well with them.  He denies palpitations or racing HR. He remains on diltiazem.  BP is controlled.   The patient does not have symptoms concerning for COVID-19 infection (fever, chills, cough, or new shortness of breath).    Past Medical History:    Diagnosis Date  . Allergic rhinitis   . Nephrolithiasis   . Overweight   . PAF (paroxysmal atrial fibrillation) ([place]) 12/02/2018   Noted in the setting of what was likely acute on chronic PEs.  . Pulmonary embolus ([place]) 12/01/2018   With acute cor pulmonale: RV dilation with pressure and volume overload on echo.   Past Surgical History:  Procedure Laterality Date  . 24-HOUR HOLTER MONITOR  11/2018   Sinus rhythm with PVCs burden of 5.5.  1 3 beat salvo.  Rare PACs.  . LOWER EXTREMITY VENOUS DOPPLERS  12/02/2018   Acute DVT involving left peroneal vein.  Cystic structure found in right popliteal fossa.  . TRANSTHORACIC ECHOCARDIOGRAM  11/2018   In setting of acute PE:  Mild LVH.  Focal basal hypertrophy.  EF 55 to 60%.  Mild MR.  Severely dilated and hypokinetic RV with severely dilated RA.  Estimated PA pressure 42 mmHg.  No PFO.  Dilated IVC and blunted respirophasic change, consistent with elevated CVP. -->  Suggests acute cor pulmonale  . TRANSTHORACIC ECHOCARDIOGRAM  February 04, 2028   EF 60-65%.  Mild LVH.  GR 1 DD.  RV normal size and function.  Mild thickening of aortic valve but no stenosis with significant sclerosis.  Mildly dilated aortic root.     No outpatient medications have been marked as taking for the 02/12/20 encounter (Appointment) with Lendon Colonel, NP.     Allergies:   Patient has  no known allergies.   Social History   Tobacco Use  . Smoking status: Never Smoker  . Smokeless tobacco: Never Used  Substance Use Topics  . Alcohol use: Yes    Comment: rare  . Drug use: Never     Family Hx: The patient's family history includes Anemia in his father; Asthma in his mother; Atrial fibrillation in an other family member; Hypertension in his mother.  ROS:   Please see the history of present illness.    All other systems reviewed and are negative.   Prior CV studies:   The following studies were reviewed today: Echocardiogram 02/03/2019 1. The left  ventricle has normal systolic function with an ejection  fraction of 60-65%. The cavity size was mildly dilated. There is mildly  increased left ventricular wall thickness. Left ventricular diastolic  Doppler parameters are consistent with  impaired relaxation.  2. The right ventricle has normal systolic function. The cavity was  normal. There is no increase in right ventricular wall thickness.  3. The mitral valve is normal in structure. Mild thickening of the mitral  valve leaflet.  4. The tricuspid valve is normal in structure.  5. The aortic valve is tricuspid Mild thickening of the aortic valve.  6. The pulmonic valve was normal in structure.  7. There is mild dilatation of the aortic root measuring 40 mm.  8. Normal LV systolic function; mild diastolic dysfunction; mild LVE;  mild LVE; mildly dilated aortic root.   Holter Monitor 11/28/2018    NSR  PVC's with a burden of 5.5%. One three beat salvo.  Rare PAC's  No diary or complaints.     Labs/Other Tests and Data Reviewed:    EKG:  No ECG reviewed.  Recent Labs: No results found for requested labs within last 8760 hours.   Recent Lipid Panel No results found for: CHOL, TRIG, HDL, CHOLHDL, LDLCALC, LDLDIRECT  Wt Readings from Last 3 Encounters:  05/22/19 223 lb (101.2 kg)  01/03/19 231 lb 3.2 oz (104.9 kg)  12/01/18 224 lb (101.6 kg)     Objective:    Vital Signs:  There were no vitals taken for this visit.   VITAL SIGNS:  reviewed GEN:  no acute distress EYES:  sclerae anicteric, EOMI - Extraocular Movements Intact RESPIRATORY:  normal respiratory effort, symmetric expansion MUSCULOSKELETAL:  no obvious deformities. NEURO:  alert and oriented x 3, no obvious focal deficit PSYCH:  normal affect  ASSESSMENT & PLAN:    1.PAF: Thought to be related to PE from traveling. He has not had any issues with recurrence of this. His only symptom was dyspnea. He is compliant with Diltiazem.  I will stop the  Eliquis since it has been over a year (Dec 2019) since his incidence of PE.  I will check with Dr. Herbie Baltimore about how long he wishes to hold this anticoagulation before checking him for recurrence of PE.  He indicated on his last note that a trial of holding Eliquis would be considered.  Continue diltiazem.   2. Hypercholesterolemia: On fish oil daily. Labs are completed by PCP.   COVID-19 Education: The signs and symptoms of COVID-19 were discussed with the patient and how to seek care for testing (follow up with PCP or arrange E-visit). The importance of social distancing was discussed today.  Time:   Today, I have spent 15 minutes with the patient with telehealth technology discussing the above problems.     Medication Adjustments/Labs and Tests Ordered: Current medicines are  reviewed at length with the patient today.  Concerns regarding medicines are outlined above.   Tests Ordered: No orders of the defined types were placed in this encounter.   Medication Changes: No orders of the defined types were placed in this encounter.   Disposition:  Follow up 6 months with Dr. Herbie Baltimore.   Signed, Bettey Mare. Liborio Nixon, ANP, AACC  02/11/2020 4:37 PM    [facility]

## 2020-02-12 ENCOUNTER — Telehealth: Payer: Self-pay | Admitting: *Deleted

## 2020-02-12 ENCOUNTER — Telehealth (INDEPENDENT_AMBULATORY_CARE_PROVIDER_SITE_OTHER): Payer: BC Managed Care – PPO | Admitting: Adult Health

## 2020-02-12 ENCOUNTER — Encounter: Payer: Self-pay | Admitting: Adult Health

## 2020-02-12 VITALS — Ht 78.0 in | Wt 225.0 lb

## 2020-02-12 DIAGNOSIS — I2602 Saddle embolus of pulmonary artery with acute cor pulmonale: Secondary | ICD-10-CM

## 2020-02-12 DIAGNOSIS — Z79899 Other long term (current) drug therapy: Secondary | ICD-10-CM

## 2020-02-12 DIAGNOSIS — I2609 Other pulmonary embolism with acute cor pulmonale: Secondary | ICD-10-CM

## 2020-02-12 DIAGNOSIS — I480 Paroxysmal atrial fibrillation: Secondary | ICD-10-CM | POA: Diagnosis not present

## 2020-02-12 NOTE — Patient Instructions (Signed)
Medication Instructions:  STOP- Eliquis  *If you need a refill on your cardiac medications before your next appointment, please call your pharmacy*   Lab Work: None Ordered  Testing/Procedures: None Ordered   Follow-Up: At [facility], you and your health needs are our priority.  As part of our continuing mission to provide you with exceptional heart care, we have created designated Provider Care Teams.  These Care Teams include your primary Cardiologist (physician) and Advanced Practice Providers (APPs -  Physician Assistants and Nurse Practitioners) who all work together to provide you with the care you need, when you need it.  We recommend signing up for the patient portal called "MyChart".  Sign up information is provided on this After Visit Summary.  MyChart is used to connect with patients for Virtual Visits (Telemedicine).  Patients are able to view lab/test results, encounter notes, upcoming appointments, etc.  Non-urgent messages can be sent to your provider as well.   To learn more about what you can do with MyChart, go to [email or web address].    Your next appointment:   6 month(s)  The format for your next appointment:   In Person  Provider:   You may see Bryan Lemma, MD or one of the following Advanced Practice Providers on your designated Care Team:    Theodore Demark, PA-C  Joni Reining, DNP, ANP  Cadence Fransico Namon, NP

## 2020-02-12 NOTE — Telephone Encounter (Signed)
As per Dr Herbie Baltimore requested lab order and mail to pt

## 2020-02-12 NOTE — Telephone Encounter (Signed)
-----   Message from Jodelle Gross, NP sent at 02/12/2020  9:05 AM EST ----- Regarding: Labs Mr. Freese needs to have labs per Dr. Elissa Hefty request.   Please order: Factor V Leiden (Active Protein C Resistance)  CBC Prothrombin Gene Mutation ([id number])    Please make them to Dr. Elissa Hefty attention.    Thank you.  Samara Deist

## 2020-03-10 DIAGNOSIS — Z79899 Other long term (current) drug therapy: Secondary | ICD-10-CM | POA: Diagnosis not present

## 2020-03-10 DIAGNOSIS — I2602 Saddle embolus of pulmonary artery with acute cor pulmonale: Secondary | ICD-10-CM | POA: Diagnosis not present

## 2020-03-16 LAB — FACTOR 5 LEIDEN

## 2020-03-16 LAB — CBC
Hematocrit: 44 % (ref 37.5–51.0)
Hemoglobin: 15.6 g/dL (ref 13.0–17.7)
MCH: 29.2 pg (ref 26.6–33.0)
MCHC: 35.5 g/dL (ref 31.5–35.7)
MCV: 82 fL (ref 79–97)
Platelets: 234 10*3/uL (ref 150–450)
RBC: 5.35 x10E6/uL (ref 4.14–5.80)
RDW: 12.8 % (ref 11.6–15.4)
WBC: 7.8 10*3/uL (ref 3.4–10.8)

## 2020-03-16 LAB — PROTHROMBIN GENE MUTATION

## 2020-07-02 DIAGNOSIS — I10 Essential (primary) hypertension: Secondary | ICD-10-CM | POA: Diagnosis not present

## 2020-07-13 DIAGNOSIS — Z20822 Contact with and (suspected) exposure to covid-19: Secondary | ICD-10-CM | POA: Diagnosis not present

## 2020-07-26 ENCOUNTER — Other Ambulatory Visit: Payer: Self-pay

## 2020-07-26 ENCOUNTER — Ambulatory Visit: Payer: BC Managed Care – PPO | Admitting: Cardiology

## 2020-07-26 ENCOUNTER — Encounter: Payer: Self-pay | Admitting: Cardiology

## 2020-07-26 VITALS — BP 136/88 | HR 64 | Ht 78.0 in | Wt 242.0 lb

## 2020-07-26 DIAGNOSIS — I480 Paroxysmal atrial fibrillation: Secondary | ICD-10-CM | POA: Diagnosis not present

## 2020-07-26 DIAGNOSIS — I2609 Other pulmonary embolism with acute cor pulmonale: Secondary | ICD-10-CM

## 2020-07-26 DIAGNOSIS — E785 Hyperlipidemia, unspecified: Secondary | ICD-10-CM

## 2020-07-26 MED ORDER — ROSUVASTATIN CALCIUM 20 MG PO TABS: 20.0000 mg | ORAL_TABLET | Freq: Every day | ORAL | 3 refills | Status: DC

## 2020-07-26 NOTE — Progress Notes (Signed)
Primary Care Provider: Shirlyn GoltzWright, Kristen M, PA-C (Inactive) Cardiologist: Bryan Lemmaavid Manan Olmo, MD Electrophysiologist: None  Clinic Note: Chief Complaint  Patient presents with  . Follow-up    Doing well  . Atrial Fibrillation    No recurrence outside of PE   HPI:    Phillip Dalton is a 69 y.o. male with a PMH notable for history of PAF in the setting of PE with cor pulmonale back in December 2019 who presents today for 6239-month follow-up.  Phillip Dalton was last seen by me via telemedicine back in June 2020.  This is a second time I saw him since January 2020.  We reduce his diltiazem to 90 mg daily at that time, chose to continue Eliquis.  He had had Covid -5 test was contact with a family member who had Covid.  He had a follow-up echocardiogram done at this time which showed resolution of signs of RV failure.  He was then seen by Bailey MechKatherine Lawrence in March 2021 to discuss need for anticoagulation.  He was doing well.  Was felt that the PEs were related to travel; -->  negative hypercoagulable work-up.  Eliquis was discontinued.  Recent Hospitalizations: None  Reviewed  CV studies:    The following studies were reviewed today: (if available, images/films reviewed: From Epic Chart or Care Everywhere) . None:   Interval History:   Phillip Dalton presents today for 2573-month follow-up doing well.  He has not had any further chest pain or dyspnea.  No palpitations.  No CHF symptoms.  He is active with no complaints.  CV Review of Symptoms (Summary) Cardiovascular ROS: no chest pain or dyspnea on exertion negative for - edema, irregular heartbeat, orthopnea, palpitations, paroxysmal nocturnal dyspnea, rapid heart rate, shortness of breath or Syncope/near syncope, TIA/amaurosis fugax, claudication  The patient does not have symptoms concerning for COVID-19 infection (fever, chills, cough, or new shortness of breath).  The patient is practicing social distancing & Masking.    Immunization History  Administered Date(s) Administered  . PFIZER SARS-COV-2 Vaccination 01/15/2020, 02/09/2020    REVIEWED OF SYSTEMS   Review of Systems  Constitutional: Negative for malaise/fatigue and weight loss.  HENT: Negative for nosebleeds.   Respiratory: Negative for cough and shortness of breath.   Gastrointestinal: Negative for blood in stool and melena.  Genitourinary: Negative for hematuria.  Musculoskeletal: Negative for falls and joint pain.  Neurological: Negative for dizziness.  Psychiatric/Behavioral: Negative.     I have reviewed and (if needed) personally updated the patient's problem list, medications, allergies, past medical and surgical history, social and family history.   PAST MEDICAL HISTORY   Past Medical History:  Diagnosis Date  . Allergic rhinitis   . Nephrolithiasis   . Overweight   . PAF (paroxysmal atrial fibrillation) ([facility]) 12/02/2018   Noted in the setting of what was likely acute on chronic PEs.  . Pulmonary embolus ([facility]) 12/01/2018   With acute cor pulmonale: RV dilation with pressure and volume overload on echo.    PAST SURGICAL HISTORY   Past Surgical History:  Procedure Laterality Date  . 24-HOUR HOLTER MONITOR  11/2018   Sinus rhythm with PVCs burden of 5.5.  1 3 beat salvo.  Rare PACs.  . LOWER EXTREMITY VENOUS DOPPLERS  12/02/2018   Acute DVT involving left peroneal vein.  Cystic structure found in right popliteal fossa.  . TRANSTHORACIC ECHOCARDIOGRAM  11/2018   In setting of acute PE:  Mild LVH.  Focal basal hypertrophy.  EF 55 to 60%.  Mild MR.  Severely dilated and hypokinetic RV with severely dilated RA.  Estimated PA pressure 42 mmHg.  No PFO.  Dilated IVC and blunted respirophasic change, consistent with elevated CVP. -->  Suggests acute cor pulmonale  . TRANSTHORACIC ECHOCARDIOGRAM  02/03/2019   EF 60-65%.  Mild LVH.  GR 1 DD.  RV normal size and function.  Mild thickening of aortic valve but no stenosis with  significant sclerosis.  Mildly dilated aortic root.    MEDICATIONS/ALLERGIES   Current Meds  Medication Sig  . arginine 500 MG tablet Take 500 mg by mouth daily.  . Ascorbic Acid (VITAMIN C) 1000 MG tablet Take 1,000 mg by mouth daily.  Marland Kitchen b complex vitamins tablet Take 1 tablet by mouth daily.  . Chromium Picolinate 200 MCG TABS Take by mouth.  . diltiazem (CARDIZEM SR) 90 MG 12 hr capsule Take 90 mg by mouth daily.  . Glucosamine 500 MG CAPS Take 1 capsule by mouth daily.  . Omega-3 Fatty Acids (FISH OIL) 1000 MG CAPS Take 1 capsule by mouth daily.  Marland Kitchen selenium 200 MCG TABS tablet Take 1 tablet by mouth daily.  . vitamin E 100 UNIT capsule Take 1 capsule by mouth daily.    No Known Allergies  SOCIAL HISTORY/FAMILY HISTORY   Reviewed in Epic:  Pertinent findings: N/A  OBJCTIVE -PE, EKG, labs   Wt Readings from Last 3 Encounters:  07/26/20 242 lb (109.8 kg)  02/12/20 225 lb (102.1 kg)  05/22/19 223 lb (101.2 kg)    Physical Exam: BP 136/88   Pulse 64   Ht 6\' 6"  (1.981 m)   Wt 242 lb (109.8 kg)   BMI 27.97 kg/m  Physical Exam Vitals reviewed.  Constitutional:      General: He is not in acute distress.    Appearance: Normal appearance. He is normal weight. He is not ill-appearing.  HENT:     Head: Normocephalic and atraumatic.  Neck:     Vascular: No carotid bruit, hepatojugular reflux or JVD.  Cardiovascular:     Rate and Rhythm: Normal rate and regular rhythm.     Pulses: Normal pulses.     Heart sounds: No murmur heard.  No friction rub. No gallop.      Comments: No ectopy.  Nondisplaced PMI Pulmonary:     Effort: Pulmonary effort is normal. No respiratory distress.     Breath sounds: Normal breath sounds.  Chest:     Chest wall: No tenderness.  Musculoskeletal:        General: No swelling. Normal range of motion.  Neurological:     General: No focal deficit present.     Mental Status: He is alert and oriented to person, place, and time.  Psychiatric:         Mood and Affect: Mood normal.        Behavior: Behavior normal.        Thought Content: Thought content normal.        Judgment: Judgment normal.      Adult ECG Report n/a  Recent Labs: December 26, 2019: TC 236, TG 58, HDL 62, LDL 164.  A1c 5.6. No results found for: CHOL, HDL, LDLCALC, LDLDIRECT, TRIG, CHOLHDL Lab Results  Component Value Date   CREATININE 1.24 12/05/2018   BUN 12 12/05/2018   NA 137 12/05/2018   K 4.3 12/05/2018   CL 104 12/05/2018   CO2 22 12/05/2018   Lab Results  Component Value Date   TSH 2.446 12/03/2018  ASSESSMENT/PLAN    Problem List Items Addressed This Visit    Pulmonary embolism with acute cor pulmonale ([facility]) (Chronic)    He recovered well.  No further symptoms.  This was probably thought to be related to recent travel.  Hypercoagulable state was negative.  RV has recovered on echo.  This was likely the cause for his A. Fib.  No longer on Eliquis.      Relevant Medications   rosuvastatin (CRESTOR) 20 MG tablet   PAF (paroxysmal atrial fibrillation) ([facility]) - Primary (Chronic)    No real further tachycardia spells.  No further PE episodes.  Negative hypercoagulable work-up.  Is been quite a long time since his PE and A. Fib -> we therefore felt it was safe to stop Eliquis however if there is a recurrence, would restart.  He is on low-dose diltiazem which is pretty much there to keep his rate controlled.  Tolerating okay.      Relevant Medications   rosuvastatin (CRESTOR) 20 MG tablet   Hyperlipidemia with target LDL less than 100 (Chronic)    69 year old gentleman with very poorly controlled lipids based on recent labs.  Target LDL should be less than 100.  Plan: Start Crestor 20 mg- start for the first month taking three days a week, then try to increase- goal is to take daily.  --> Should need follow-up labs in 3 to 4 months.  (This can be handled by PCP)      Relevant Medications   rosuvastatin (CRESTOR) 20 MG tablet        COVID-19 Education: The signs and symptoms of COVID-19 were discussed with the patient and how to seek care for testing (follow up with PCP or arrange E-visit).   The importance of social distancing and COVID-19 vaccination was discussed today.  I spent a total of with the patient. >  50% of the time was spent in direct patient consultation.  Additional time spent with chart review  / charting (studies, outside notes, etc): 8 Total Time: 24 min   Current medicines are reviewed at length with the patient today.  (+/- concerns) none  Notice: This dictation was prepared with Dragon dictation along with smaller phrase technology. Any transcriptional errors that result from this process are unintentional and may not be corrected upon review.  Patient Instructions / Medication Changes & Studies & Tests Ordered   Patient Instructions  Medication Instructions:  Start Crestor 20 mg- start for the first month taking three days a week, then try to increase- goal is to take daily.   *If you need a refill on your cardiac medications before your next appointment, please call your pharmacy*   Lab Work: Have Primary Care check labs- if they do, please send it to [place].   If you have labs (blood work) drawn today and your tests are completely normal, you will receive your results only by: Marland Kitchen MyChart Message (if you have MyChart) OR . A paper copy in the mail If you have any lab test that is abnormal or we need to change your treatment, we will call you to review the results.   Follow-Up: At [facility], you and your health needs are our priority.  As part of our continuing mission to provide you with exceptional heart care, we have created designated Provider Care Teams.  These Care Teams include your primary Cardiologist (physician) and Advanced Practice Providers (APPs -  Physician Assistants and Nurse Practitioners) who all work together to provide you  with the care you need, when  you need it.  We recommend signing up for the patient portal called "MyChart".  Sign up information is provided on this After Visit Summary.  MyChart is used to connect with patients for Virtual Visits (Telemedicine).  Patients are able to view lab/test results, encounter notes, upcoming appointments, etc.  Non-urgent messages can be sent to your provider as well.   To learn more about what you can do with MyChart, go to [email or web address].    Your next appointment:   12 month(s)  The format for your next appointment:   In Person  Provider:   Bryan Lemma, MD       Studies Ordered:   No orders of the defined types were placed in this encounter.    Bryan Lemma, M.D., M.S. Interventional Cardiologist   Pager # [phone] Phone # [phone] [address] [place], [place] [address]   Thank you for choosing Heartcare at [facility]!!

## 2020-07-26 NOTE — Patient Instructions (Signed)
Medication Instructions:  Start Crestor 20 mg- start for the first month taking three days a week, then try to increase- goal is to take daily.   *If you need a refill on your cardiac medications before your next appointment, please call your pharmacy*   Lab Work: Have Primary Care check labs- if they do, please send it to [place].   If you have labs (blood work) drawn today and your tests are completely normal, you will receive your results only by: Marland Kitchen MyChart Message (if you have MyChart) OR . A paper copy in the mail If you have any lab test that is abnormal or we need to change your treatment, we will call you to review the results.   Follow-Up: At [facility], you and your health needs are our priority.  As part of our continuing mission to provide you with exceptional heart care, we have created designated Provider Care Teams.  These Care Teams include your primary Cardiologist (physician) and Advanced Practice Providers (APPs -  Physician Assistants and Nurse Practitioners) who all work together to provide you with the care you need, when you need it.  We recommend signing up for the patient portal called "MyChart".  Sign up information is provided on this After Visit Summary.  MyChart is used to connect with patients for Virtual Visits (Telemedicine).  Patients are able to view lab/test results, encounter notes, upcoming appointments, etc.  Non-urgent messages can be sent to your provider as well.   To learn more about what you can do with MyChart, go to [email or web address].    Your next appointment:   12 month(s)  The format for your next appointment:   In Person  Provider:   Bryan Lemma, MD

## 2020-07-29 IMAGING — DX DG CHEST 1V PORT
1 series · 1 of 1 positions shown · non-contrast
Comparison: None.

CLINICAL DATA: Shortness of breath.

EXAM:
PORTABLE CHEST 1 VIEW

[chest]
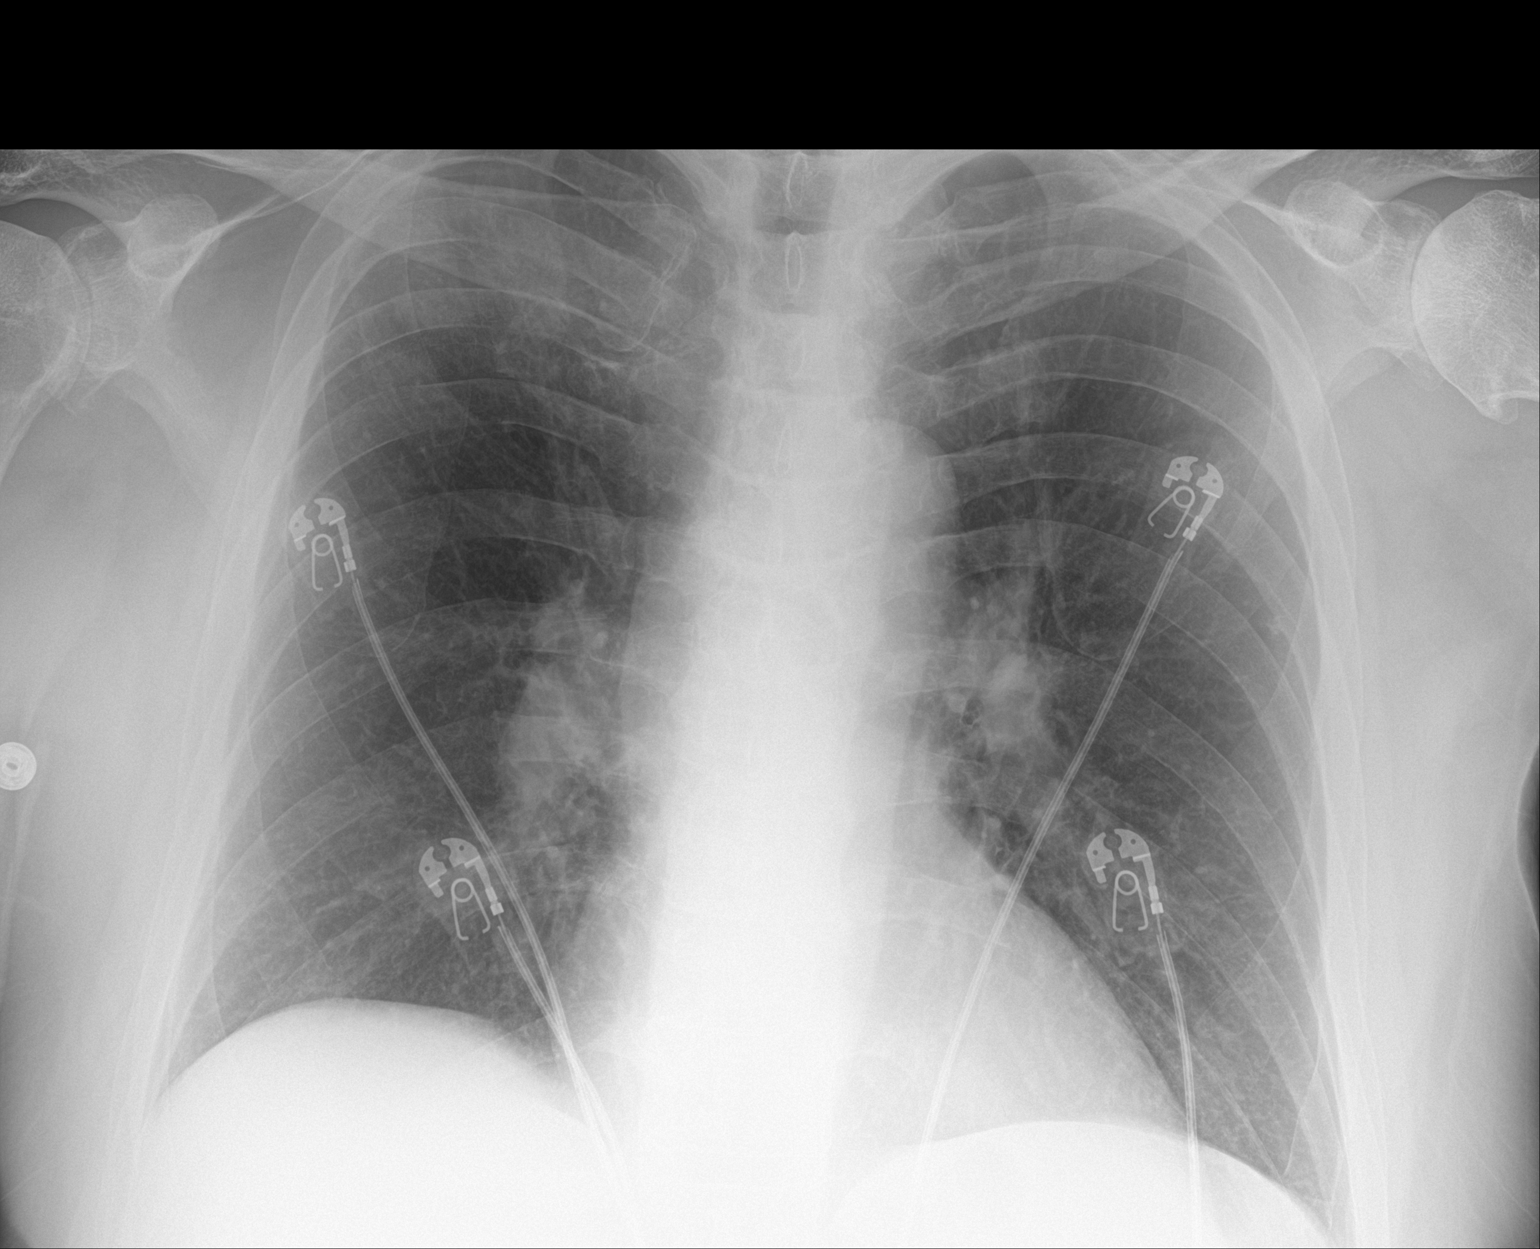

[1 of 1 positions shown; findings below may reference images not displayed]

FINDINGS: Lungs are adequately inflated with mild hazy density over the right
upper lobe/apex which may be true parenchymal process versus
overlapping bony and vascular structures. No effusion.
Cardiomediastinal silhouette and remainder the exam is unremarkable.
IMPRESSION: Mild hazy density over the right upper lobe/apex which may be true
parenchymal process versus overlapping bony and vascular
structures.. Consider PA and lateral chest radiograph for further
evaluation.

## 2020-07-29 IMAGING — CT CT ANGIO CHEST
2 of 8 series · 18 of 46 positions shown · IV contrast (iopamidol)
Comparison: None.

CLINICAL DATA: Short of breath.  Acute onset.

EXAM:
CT ANGIOGRAPHY CHEST WITH CONTRAST
TECHNIQUE: Multidetector CT imaging of the chest was performed using the
standard protocol during bolus administration of intravenous
contrast. Multiplanar CT image reconstructions and MIPs were
obtained to evaluate the vascular anatomy.
CONTRAST:  75mL [id number] IOPAMIDOL ([id number]) INJECTION 76%

[Series 6: thins · axial · 0.98mm/px · z∈[+1262,+1586]mm · 15 of 358 slices shown]
[im 17/358  lung]
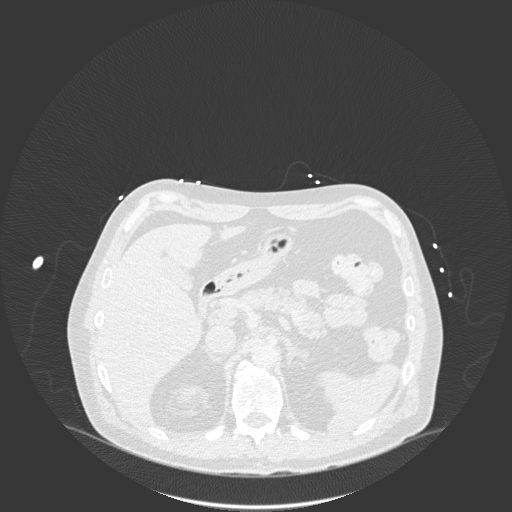
[im 49/358  soft-tissue]
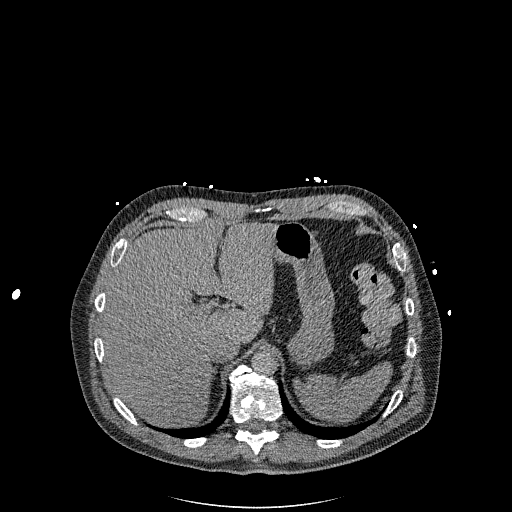
[im 65/358  lung]
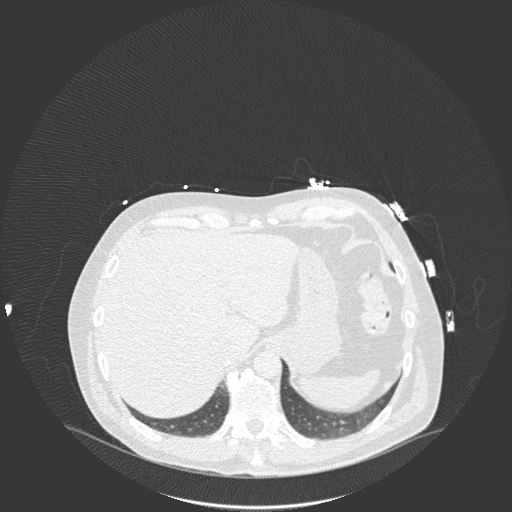
[im 82/358  soft-tissue]
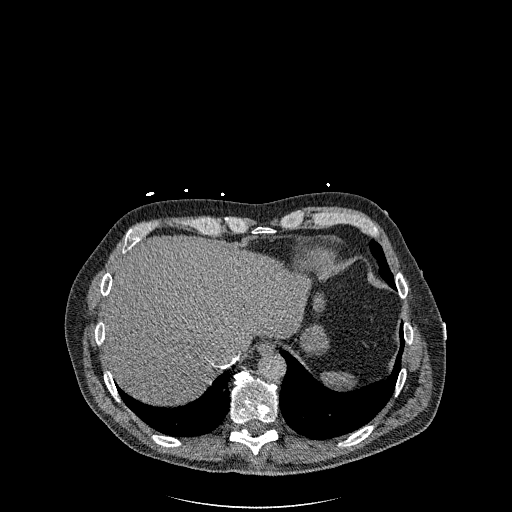
[im 114/358  lung]
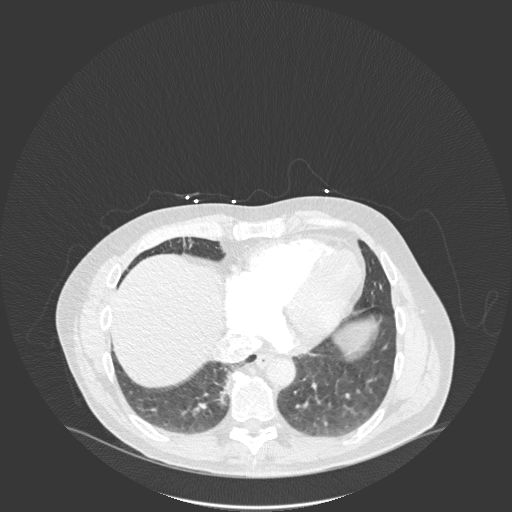
[im 130/358  soft-tissue]
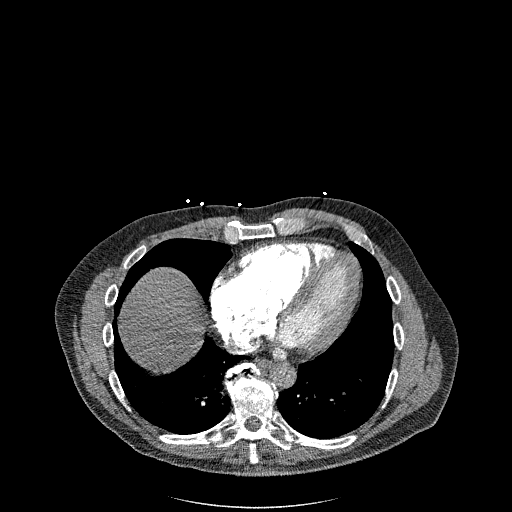
[im 163/358  lung]
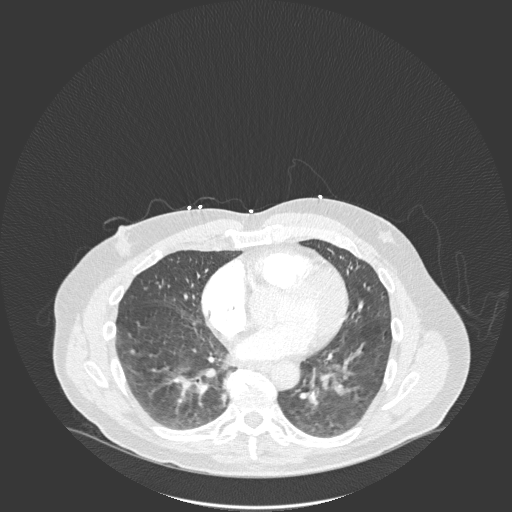
[im 179/358  soft-tissue]
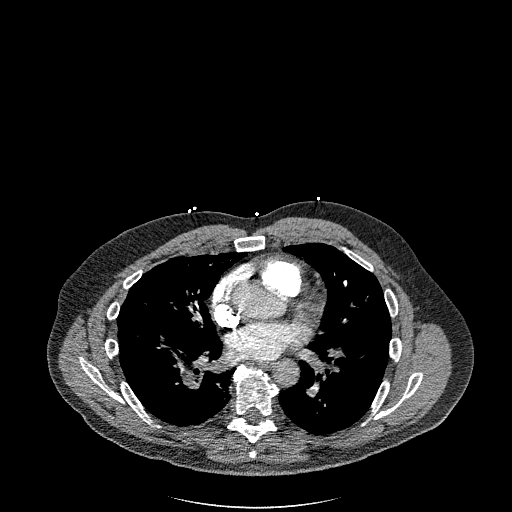
[im 195/358  lung]
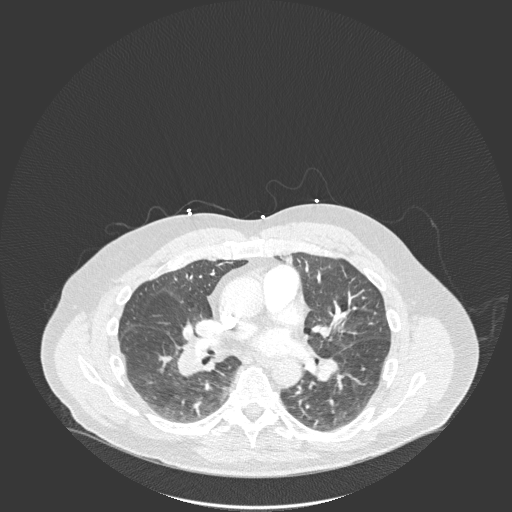
[im 228/358  soft-tissue]
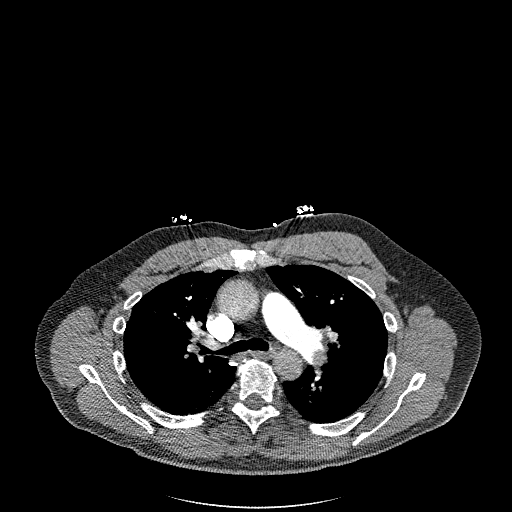
[im 244/358  lung]
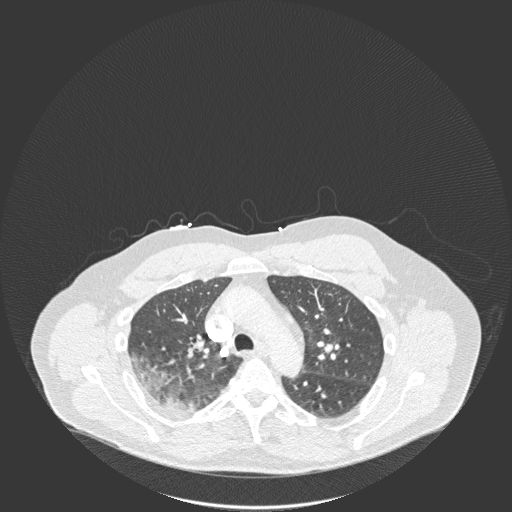
[im 276/358  soft-tissue]
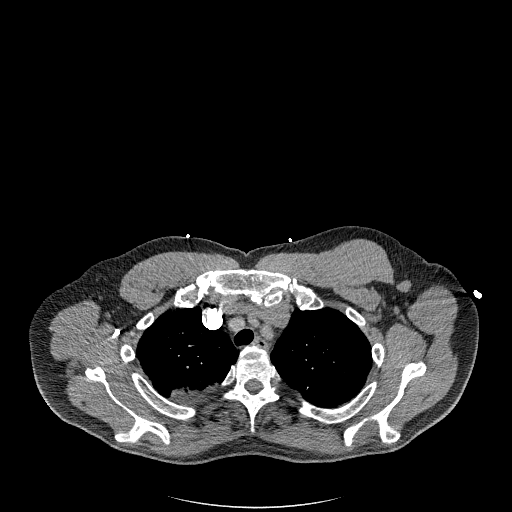
[im 293/358  lung]
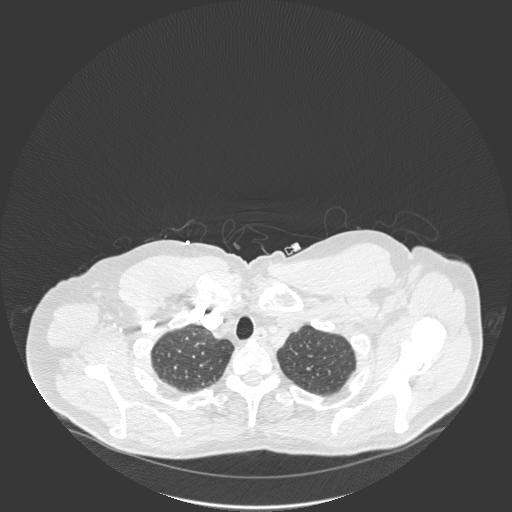
[im 309/358  soft-tissue]
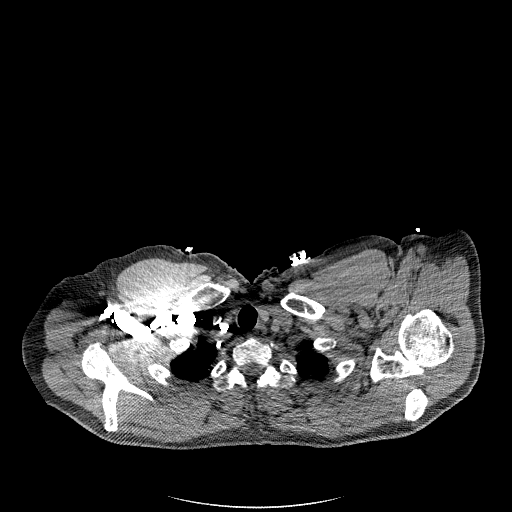
[im 341/358  lung]
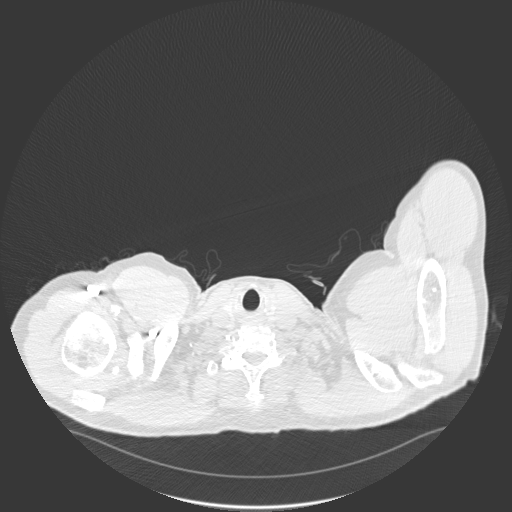

[Series 8: coronal mpr · coronal · 0.69mm/px · 3 of 161 slices shown]
[im 41/161  soft-tissue]
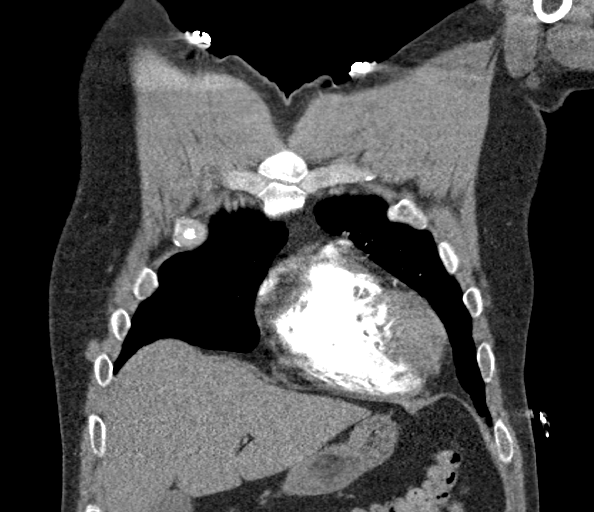
[im 81/161  soft-tissue]
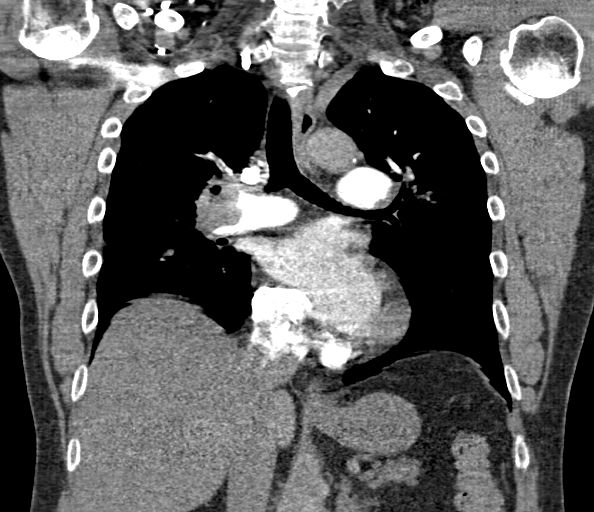
[im 121/161  soft-tissue]
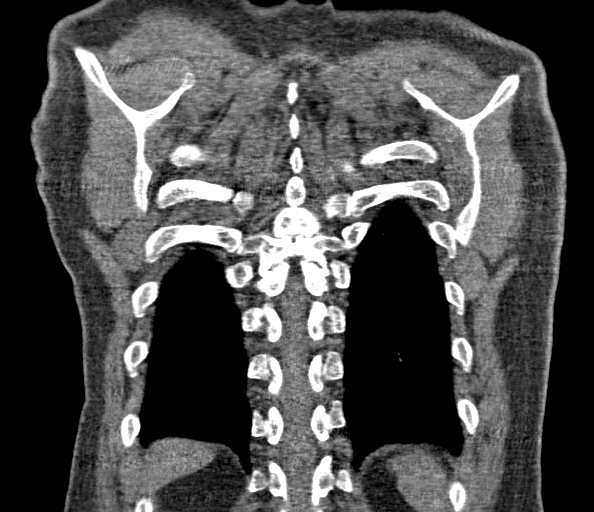

[18 of 46 positions shown; findings below may reference images not displayed]

FINDINGS: Cardiovascular: There are large filling defects within the distal
LEFT and RIGHT pulmonary arteries which are occlusive. Emboli extend
into the lower lobe pulmonary arteries and upper lobe pulmonary
arteries. Emboli extend into the RIGHT middle lobe and lingula.
Overall clot burden is severe.

There is evidence of RIGHT ventricular strain with the ratio of the
RIGHT ventricular diameter ratio to LEFT ventricular diameter
greater than 1 (RV/LV equal 1.44).

There is flattening of the intraventricular septum along the RIGHT
ventricular interface.

Mediastinum/Nodes: No axillary supraclavicular adenopathy. No
mediastinal hilar adenopathy. No pericardial effusion.

Lungs/Pleura: There is fluffy airspace disease in the posterior
segment of the RIGHT lower lobe. Subtle peripheral airspace disease
in the lateral segment of the RIGHT lower lobe (image 59/7).
Calcified nodule in the LEFT upper lobe is small.

Upper Abdomen: Limited view of the liver, kidneys, pancreas are
unremarkable. Normal adrenal glands.

Musculoskeletal: No aggressive osseous lesion.

Review of the MIP images confirms the above findings.
IMPRESSION: 1. Bilateral severe occlusive pulmonary emboli involving the
proximal pulmonary arteries and a majority of the distal pulmonary
arteries.
2. Positive for acute PE with CT evidence of right heart strain
(RV/LV Ratio = 1.4) consistent with at least submassive
(intermediate risk) PE. The presence of right heart strain has been
associated with an increased risk of morbidity and mortality. Please
activate Code PE by paging [phone].
3. Airspace disease in the posterior RIGHT lower lobe and to lesser
degree the lateral RIGHT lower lobe is presumed mild pulmonary
infarction.

Critical Value/emergent results were called by telephone at the time
of interpretation on 12/01/2018 at [DATE] to Dr. Marle Joachim, who
verbally acknowledged these results.

## 2020-08-02 ENCOUNTER — Encounter: Payer: Self-pay | Admitting: Cardiology

## 2020-08-02 NOTE — Assessment & Plan Note (Signed)
69 year old gentleman with very poorly controlled lipids based on recent labs.  Target LDL should be less than 100.  Plan: Start Crestor 20 mg- start for the first month taking three days a week, then try to increase- goal is to take daily.  --> Should need follow-up labs in 3 to 4 months.  (This can be handled by PCP)

## 2020-08-02 NOTE — Assessment & Plan Note (Signed)
No real further tachycardia spells.  No further PE episodes.  Negative hypercoagulable work-up.  Is been quite a long time since his PE and A. Fib -> we therefore felt it was safe to stop Eliquis however if there is a recurrence, would restart.  He is on low-dose diltiazem which is pretty much there to keep his rate controlled.  Tolerating okay.

## 2020-08-02 NOTE — Assessment & Plan Note (Signed)
He recovered well.  No further symptoms.  This was probably thought to be related to recent travel.  Hypercoagulable state was negative.  RV has recovered on echo.  This was likely the cause for his A. Fib.  No longer on Eliquis.

## 2020-09-03 DIAGNOSIS — Z20822 Contact with and (suspected) exposure to covid-19: Secondary | ICD-10-CM | POA: Diagnosis not present

## 2020-09-03 DIAGNOSIS — Z03818 Encounter for observation for suspected exposure to other biological agents ruled out: Secondary | ICD-10-CM | POA: Diagnosis not present

## 2020-10-11 DIAGNOSIS — Z1211 Encounter for screening for malignant neoplasm of colon: Secondary | ICD-10-CM | POA: Diagnosis not present

## 2020-11-23 DIAGNOSIS — Z01812 Encounter for preprocedural laboratory examination: Secondary | ICD-10-CM | POA: Diagnosis not present

## 2020-11-26 DIAGNOSIS — K635 Polyp of colon: Secondary | ICD-10-CM | POA: Diagnosis not present

## 2020-11-26 DIAGNOSIS — Z1211 Encounter for screening for malignant neoplasm of colon: Secondary | ICD-10-CM | POA: Diagnosis not present

## 2020-11-26 DIAGNOSIS — K640 First degree hemorrhoids: Secondary | ICD-10-CM | POA: Diagnosis not present

## 2021-01-11 DIAGNOSIS — E785 Hyperlipidemia, unspecified: Secondary | ICD-10-CM | POA: Diagnosis not present

## 2021-01-11 DIAGNOSIS — I10 Essential (primary) hypertension: Secondary | ICD-10-CM | POA: Diagnosis not present

## 2021-01-11 DIAGNOSIS — Z125 Encounter for screening for malignant neoplasm of prostate: Secondary | ICD-10-CM | POA: Diagnosis not present

## 2021-01-11 DIAGNOSIS — Z86711 Personal history of pulmonary embolism: Secondary | ICD-10-CM | POA: Diagnosis not present

## 2021-01-11 DIAGNOSIS — Z0189 Encounter for other specified special examinations: Secondary | ICD-10-CM | POA: Diagnosis not present

## 2021-05-31 ENCOUNTER — Ambulatory Visit (INDEPENDENT_AMBULATORY_CARE_PROVIDER_SITE_OTHER): Payer: BC Managed Care – PPO

## 2021-05-31 ENCOUNTER — Other Ambulatory Visit: Payer: Self-pay

## 2021-05-31 ENCOUNTER — Other Ambulatory Visit: Payer: Self-pay | Admitting: Nurse Practitioner

## 2021-05-31 DIAGNOSIS — M79669 Pain in unspecified lower leg: Secondary | ICD-10-CM | POA: Diagnosis not present

## 2021-05-31 DIAGNOSIS — M79662 Pain in left lower leg: Secondary | ICD-10-CM

## 2021-05-31 DIAGNOSIS — M7122 Synovial cyst of popliteal space [Baker], left knee: Secondary | ICD-10-CM | POA: Diagnosis not present

## 2021-05-31 DIAGNOSIS — I82812 Embolism and thrombosis of superficial veins of left lower extremities: Secondary | ICD-10-CM | POA: Diagnosis not present

## 2021-09-02 ENCOUNTER — Ambulatory Visit: Payer: BC Managed Care – PPO | Admitting: Cardiology

## 2021-09-15 ENCOUNTER — Other Ambulatory Visit: Payer: Self-pay | Admitting: Cardiology

## 2021-10-15 DIAGNOSIS — R070 Pain in throat: Secondary | ICD-10-CM | POA: Diagnosis not present

## 2021-10-15 DIAGNOSIS — K146 Glossodynia: Secondary | ICD-10-CM | POA: Diagnosis not present

## 2021-10-17 ENCOUNTER — Encounter: Payer: Self-pay | Admitting: Cardiology

## 2021-10-17 ENCOUNTER — Other Ambulatory Visit: Payer: Self-pay

## 2021-10-17 ENCOUNTER — Ambulatory Visit: Payer: BC Managed Care – PPO | Admitting: Cardiology

## 2021-10-17 VITALS — BP 128/74 | HR 66 | Ht 77.0 in | Wt 224.4 lb

## 2021-10-17 DIAGNOSIS — I493 Ventricular premature depolarization: Secondary | ICD-10-CM | POA: Diagnosis not present

## 2021-10-17 DIAGNOSIS — E785 Hyperlipidemia, unspecified: Secondary | ICD-10-CM | POA: Diagnosis not present

## 2021-10-17 DIAGNOSIS — I480 Paroxysmal atrial fibrillation: Secondary | ICD-10-CM

## 2021-10-17 DIAGNOSIS — I2609 Other pulmonary embolism with acute cor pulmonale: Secondary | ICD-10-CM

## 2021-10-17 NOTE — Patient Instructions (Addendum)
Medication Instructions:   *If you need a refill on your cardiac medications before your next appointment, please call your pharmacy*   Lab Work:  If you have labs (blood work) drawn today and your tests are completely normal, you will receive your results only by: MyChart Message (if you have MyChart) OR A paper copy in the mail If you have any lab test that is abnormal or we need to change your treatment, we will call you to review the results.   Testing/Procedures: Not needed   Follow-Up: At [facility], you and your health needs are our priority.  As part of our continuing mission to provide you with exceptional heart care, we have created designated Provider Care Teams.  These Care Teams include your primary Cardiologist (physician) and Advanced Practice Providers (APPs -  Physician Assistants and Nurse Practitioners) who all work together to provide you with the care you need, when you need it.  We recommend signing up for the patient portal called "MyChart".  Sign up information is provided on this After Visit Summary.  MyChart is used to connect with patients for Virtual Visits (Telemedicine).  Patients are able to view lab/test results, encounter notes, upcoming appointments, etc.  Non-urgent messages can be sent to your provider as well.   To learn more about what you can do with MyChart, go to [email or web address].    Your next appointment:   12 month(s)  The format for your next appointment:   In Person  Provider:   Bryan Lemma, MD

## 2021-10-17 NOTE — Progress Notes (Signed)
Primary Care Provider: Garth Bigness (Inactive) Cardiologist: Glenetta Hew, MD Electrophysiologist: None  Clinic Note: Chief Complaint  Patient presents with   Follow-up    Doing well.  No major issues.  No recurrent symptoms of irregular heartbeat.   Atrial Fibrillation    No recurrent symptoms.  No longer on DOAC.    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  Has not had any further episodes of unilateral swelling or dyspnea.  This is likely the cause for his brief run of A. fib.  Stable now off of DOAC.      Relevant Orders   EKG 12-Lead (Completed)   PAF (paroxysmal atrial fibrillation) (HCC) - Primary (Chronic)    Likely his episode was triggered by PE.  No longer having any issues.  Has not had any further palpitations or irregular heartbeats.  Remains diltiazem which seems to keep his heart rate relatively controlled.  Not having any breakthrough episodes of A. fib.  As such, not on DOAC.  This patients CHA2DS2-VASc Score and unadjusted Ischemic Stroke Rate (% per year) is equal to 0.6 % stroke rate/year from a score of 1  Above score calculated as 1 point each if present [CHF, HTN, DM, Vascular=MI/PAD/Aortic Plaque, Age if 65-74, or Male] Above score calculated as 2 points each if present [Age > 75, or Stroke/TIA/TE]       Relevant Orders   EKG 12-Lead (Completed)   PVC's (premature ventricular contractions) (Chronic)    No symptoms of palpitations.  He had PVCs on EKG last visit.  No recurrent PVCs.  In the absence of symptoms, would not treat further.  He is on diltiazem just in case he goes into A. fib.      Hyperlipidemia with target LDL less than 100 (Chronic)    Started on Crestor 20 mg last year.  LDL by most recent check  92.  Stable.  Tolerating statin well.  No change.       ===================================  HPI:    Phillip Dalton is a 70 y.o. male with a PMH notable for PAF in setting of PE with Cor Pulmonale in 11/2018 (resolved, no longer on DOAC.) who presents today for annual follow-up.  Phillip Dalton was last seen on July 26, 2020.  Was doing well.  No chest pain or dyspnea.  No palpitations.  No CHF symptoms.  He had recovered from COVID-19. ->  Was started on Crestor 20 mg for hyperlipidemia.  Was noted to have a negative hypercoagulable state, and PE was felt to be triggered by recent travel.  As this was likely cause for A. fib, CHA2DS2VAsc score only 1  & no recurrent Sx -> DOAC stopped.   Recent Hospitalizations: none  Reviewed  CV studies:    The following studies were reviewed today: (if available, images/films reviewed: From Epic Chart or Care Everywhere) none:  Interval History:   Phillip Dalton returns for delayed annual follow-up doing pretty well.  He seems to be tolerating rosuvastatin well.  He has had no symptoms whatsoever of rapid irregular heartbeats or palpitations.  No skipped beats.  He is quite active try to get regular amount of exercise at least 2 to 3 days a week.  Also walks around a lot at work.  CV Review of  Symptoms (Summary) Cardiovascular ROS: no chest pain or dyspnea on exertion negative for - edema, irregular heartbeat, orthopnea, palpitations, paroxysmal nocturnal dyspnea, rapid heart rate, shortness of breath, or syncope/near syncope, TIA/amaurosis fugax, claudication  REVIEWED OF SYSTEMS   Review of Systems  Constitutional:  Negative for malaise/fatigue and weight loss.       Recent viral syndrome - aches,  malaise, no Fever.    HENT:  Negative for congestion and nosebleeds.        Recent tongue swelling -- unclear etiology  Respiratory:  Negative for cough and shortness of breath.   Cardiovascular:  Negative for leg swelling.   Gastrointestinal:  Negative for blood in stool, melena, nausea and vomiting.  Genitourinary:  Negative for hematuria.  Musculoskeletal:  Positive for joint pain (mild aches - knees get sore with workouts).  Neurological:  Negative for dizziness and focal weakness.  Psychiatric/Behavioral:  Negative for depression and memory loss. The patient is not nervous/anxious and does not have insomnia.   All other systems reviewed and are negative.   I have reviewed and (if needed) personally updated the patient's problem list, medications, allergies, past medical and surgical history, social and family history.   PAST MEDICAL HISTORY   Past Medical History:  Diagnosis Date   Allergic rhinitis    Nephrolithiasis    Overweight    PAF (paroxysmal atrial fibrillation) ([place]) 12/02/2018   Noted in the setting of what was likely acute on chronic PEs. ->  No recurrent symptoms since being.  No longer on DOAC.   Pulmonary embolus ([place]) 12/01/2018   With acute cor pulmonale: RV dilation with pressure and volume overload on echo.    PAST SURGICAL HISTORY   Past Surgical History:  Procedure Laterality Date   24-HOUR HOLTER MONITOR  11/2018   Sinus rhythm with PVCs burden of 5.5.  1 3 beat salvo.  Rare PACs.   LOWER EXTREMITY VENOUS DOPPLERS  12/02/2018   Acute DVT involving left peroneal vein.  Cystic structure found in right popliteal fossa.   TRANSTHORACIC ECHOCARDIOGRAM  11/2018   In setting of acute PE:  Mild LVH.  Focal basal hypertrophy.  EF 55 to 60%.  Mild MR.  Severely dilated and hypokinetic RV with severely dilated RA.  Estimated PA pressure 42 mmHg.  No PFO.  Dilated IVC and blunted respirophasic change, consistent with elevated CVP. -->  Suggests acute cor pulmonale   TRANSTHORACIC ECHOCARDIOGRAM  02/03/2019   EF 60-65%.  Mild LVH.  GR 1 DD.  RV normal size and function.  Mild thickening of aortic valve but no stenosis with significant sclerosis.  Mildly dilated aortic root.     Immunization History  Administered Date(s) Administered   PFIZER(Purple Top)SARS-COV-2 Vaccination 01/15/2020, 02/09/2020    MEDICATIONS/ALLERGIES   Current Meds  Medication Sig   arginine 500 MG tablet Take 500 mg by mouth daily.   Ascorbic Acid (VITAMIN C) 1000 MG tablet Take 1,000 mg by mouth daily.   b complex vitamins tablet Take 1 tablet by mouth daily.   cephALEXin (KEFLEX) 500 MG capsule Take 500 mg by mouth 2 (two) times daily.   Chromium Picolinate 200 MCG TABS Take by mouth.   diltiazem (CARDIZEM SR) 90 MG 12 hr capsule Take 90 mg by mouth daily.   Glucosamine 500 MG CAPS Take 1 capsule by mouth daily.   Omega-3 Fatty Acids (FISH OIL) 1000 MG CAPS Take 1 capsule by mouth daily.   rosuvastatin (CRESTOR) 20 MG tablet TAKE 1 TABLET BY  MOUTH EVERY DAY   selenium 200 MCG TABS tablet Take 1 tablet by mouth daily.   vitamin E 100 UNIT capsule Take 1 capsule by mouth daily.    No Known Allergies  SOCIAL HISTORY/FAMILY HISTORY   Reviewed in Epic:  Pertinent findings:  Social History   Tobacco Use   Smoking status: Never   Smokeless tobacco: Never  Substance Use Topics   Alcohol use: Yes    Comment: rare   Drug use: Never   Social History   Social History Narrative   Lives with his wife.  Very active, usually exercises 3 days a week.    OBJCTIVE -PE, EKG, labs   Wt Readings from Last 3 Encounters:  10/17/21 224 lb 6.4 oz (101.8 kg)  07/26/20 242 lb (109.8 kg)  02/12/20 225 lb (102.1 kg)    Physical Exam: BP 128/74   Pulse 66   Ht 6\' 5"  (1.956 m)   Wt 224 lb 6.4 oz (101.8 kg)   SpO2 99%   BMI 26.61 kg/m  Physical Exam Vitals reviewed.  Constitutional:      General: He is not in acute distress.    Appearance: Normal appearance. He is normal weight. He is not ill-appearing.  HENT:     Head: Normocephalic and atraumatic.  Neck:     Vascular: No carotid bruit or JVD.  Cardiovascular:     Rate and Rhythm: Normal rate and regular rhythm. No  extrasystoles are present.    Chest Wall: PMI is not displaced.     Pulses: Normal pulses.     Heart sounds: S1 normal and S2 normal. No murmur heard.   No friction rub. No gallop.  Pulmonary:     Effort: Pulmonary effort is normal. No respiratory distress.     Breath sounds: Normal breath sounds.  Chest:     Chest wall: No tenderness.  Musculoskeletal:        General: No swelling. Normal range of motion.     Cervical back: Normal range of motion and neck supple.  Skin:    General: Skin is warm and dry.  Neurological:     General: No focal deficit present.     Mental Status: He is alert and oriented to person, place, and time.  Psychiatric:        Mood and Affect: Mood normal.        Behavior: Behavior normal.        Thought Content: Thought content normal.        Judgment: Judgment normal.    Adult ECG Report  Rate: 66;  Rhythm: normal sinus rhythm and left atrial abnormality.  Otherwise normal axis, intervals and durations. ;   Narrative Interpretation: Stable EKG.  Recent Labs:   01/11/2021: TC 125, TG 75, HDL 69, LDL 92. Cr 1.24, K+ 5.2. No results found for: CHOL, HDL, LDLCALC, LDLDIRECT, TRIG, CHOLHDL Lab Results  Component Value Date   CREATININE 1.24 12/05/2018   BUN 12 12/05/2018   NA 137 12/05/2018   K 4.3 12/05/2018   CL 104 12/05/2018   CO2 22 12/05/2018   CBC Latest Ref Rng & Units 03/10/2020 12/05/2018 12/04/2018  WBC 3.4 - 10.8 x10E3/uL 7.8 13.5(H) 11.5(H)  Hemoglobin 13.0 - 17.7 g/dL 15.6 13.5 14.4  Hematocrit 37.5 - 51.0 % 44.0 39.5 43.0  Platelets 150 - 450 x10E3/uL 234 292 287    No results found for: HGBA1C Lab Results  Component Value Date   TSH 2.446 12/03/2018    ==================================================  COVID-19 Education: The signs and symptoms of COVID-19 were discussed with the patient and how to seek care for testing (follow up with PCP or arrange E-visit).    I spent a total of 18 minutes with the patient spent in direct  patient consultation.  Additional time spent with chart review  / charting (studies, outside notes, etc): 14 min Total Time: 32  min  Current medicines are reviewed at length with the patient today.  (+/- concerns) n/a  This visit occurred during the SARS-CoV-2 public health emergency.  Safety protocols were in place, including screening questions prior to the visit, additional usage of staff PPE, and extensive cleaning of exam room while observing appropriate contact time as indicated for disinfecting solutions.  Notice: This dictation was prepared with Dragon dictation along with smart phrase technology. Any transcriptional errors that result from this process are unintentional and may not be corrected upon review.  Patient Instructions / Medication Changes & Studies & Tests Ordered   Patient Instructions  Medication Instructions:   *If you need a refill on your cardiac medications before your next appointment, please call your pharmacy*   Lab Work:  If you have labs (blood work) drawn today and your tests are completely normal, you will receive your results only by: MyChart Message (if you have MyChart) OR A paper copy in the mail If you have any lab test that is abnormal or we need to change your treatment, we will call you to review the results.   Testing/Procedures: Not needed   Follow-Up: At [facility], you and your health needs are our priority.  As part of our continuing mission to provide you with exceptional heart care, we have created designated Provider Care Teams.  These Care Teams include your primary Cardiologist (physician) and Advanced Practice Providers (APPs -  Physician Assistants and Nurse Practitioners) who all work together to provide you with the care you need, when you need it.  We recommend signing up for the patient portal called "MyChart".  Sign up information is provided on this After Visit Summary.  MyChart is used to connect with patients for  Virtual Visits (Telemedicine).  Patients are able to view lab/test results, encounter notes, upcoming appointments, etc.  Non-urgent messages can be sent to your provider as well.   To learn more about what you can do with MyChart, go to [email or web address].    Your next appointment:   12 month(s)  The format for your next appointment:   In Person  Provider:   Bryan Lemma, MD       Studies Ordered:   Orders Placed This Encounter  Procedures   EKG 12-Lead     Bryan Lemma, M.D., M.S. Interventional Cardiologist   Pager # [phone] Phone # [phone] [address] [place], [place] [address]   Thank you for choosing Heartcare at [facility]!!

## 2021-11-06 ENCOUNTER — Encounter: Payer: Self-pay | Admitting: Cardiology

## 2021-11-06 NOTE — Assessment & Plan Note (Signed)
Likely his episode was triggered by PE.  No longer having any issues.  Has not had any further palpitations or irregular heartbeats.  Remains diltiazem which seems to keep his heart rate relatively controlled.  Not having any breakthrough episodes of A. fib.  As such, not on DOAC.  This patients CHA2DS2-VASc Score and unadjusted Ischemic Stroke Rate (% per year) is equal to 0.6 % stroke rate/year from a score of 1  Above score calculated as 1 point each if present [CHF, HTN, DM, Vascular=MI/PAD/Aortic Plaque, Age if 65-74, or Male] Above score calculated as 2 points each if present [Age > 75, or Stroke/TIA/TE]

## 2021-11-06 NOTE — Assessment & Plan Note (Signed)
No symptoms of palpitations.  He had PVCs on EKG last visit.  No recurrent PVCs.  In the absence of symptoms, would not treat further.  He is on diltiazem just in case he goes into A. fib.

## 2021-11-06 NOTE — Assessment & Plan Note (Signed)
Started on Crestor 20 mg last year.  LDL by most recent check 92.  Stable.  Tolerating statin well.  No change.

## 2021-11-06 NOTE — Assessment & Plan Note (Signed)
Follow-up echocardiogram showed resolved some findings of cor pulmonale.  Has not had any further episodes of unilateral swelling or dyspnea.  This is likely the cause for his brief run of A. fib.  Stable now off of DOAC.

## 2021-12-15 ENCOUNTER — Other Ambulatory Visit: Payer: Self-pay | Admitting: Cardiology

## 2022-02-06 ENCOUNTER — Ambulatory Visit: Payer: BC Managed Care – PPO | Admitting: Cardiology

## 2022-03-27 DIAGNOSIS — Z125 Encounter for screening for malignant neoplasm of prostate: Secondary | ICD-10-CM | POA: Diagnosis not present

## 2022-03-27 DIAGNOSIS — Z0000 Encounter for general adult medical examination without abnormal findings: Secondary | ICD-10-CM | POA: Diagnosis not present

## 2022-03-27 DIAGNOSIS — Z131 Encounter for screening for diabetes mellitus: Secondary | ICD-10-CM | POA: Diagnosis not present

## 2022-03-27 DIAGNOSIS — E785 Hyperlipidemia, unspecified: Secondary | ICD-10-CM | POA: Diagnosis not present

## 2022-09-07 DIAGNOSIS — L820 Inflamed seborrheic keratosis: Secondary | ICD-10-CM | POA: Diagnosis not present

## 2022-09-07 DIAGNOSIS — D1801 Hemangioma of skin and subcutaneous tissue: Secondary | ICD-10-CM | POA: Diagnosis not present

## 2022-09-07 DIAGNOSIS — L538 Other specified erythematous conditions: Secondary | ICD-10-CM | POA: Diagnosis not present

## 2022-09-07 DIAGNOSIS — L814 Other melanin hyperpigmentation: Secondary | ICD-10-CM | POA: Diagnosis not present

## 2022-09-07 DIAGNOSIS — Z789 Other specified health status: Secondary | ICD-10-CM | POA: Diagnosis not present

## 2022-09-07 DIAGNOSIS — L298 Other pruritus: Secondary | ICD-10-CM | POA: Diagnosis not present

## 2022-09-07 DIAGNOSIS — L821 Other seborrheic keratosis: Secondary | ICD-10-CM | POA: Diagnosis not present

## 2022-09-07 DIAGNOSIS — Z08 Encounter for follow-up examination after completed treatment for malignant neoplasm: Secondary | ICD-10-CM | POA: Diagnosis not present

## 2022-09-30 ENCOUNTER — Encounter: Payer: Self-pay | Admitting: Cardiology

## 2022-10-03 NOTE — Progress Notes (Signed)
Cardiology Office Note:    Date:  10/04/2022   ID:  Phillip Dalton, DOB 1951-05-10, MRN [id number]  PCP:  Shirlyn GoltzWright, Kristen M, PA-C (Inactive)   [facility] Providers Cardiologist:  Bryan Lemmaavid Harding, MD     Referring MD: No ref. provider found   CC: DOE  History of Present Illness:    Phillip Dalton is a 71 y.o. male with a hx of the following:  PAF Hx of PE  PVC's HLD PAC's  Previous cardiovascular history of paroxysmal A-fib in setting of PE with cor pulmonale in 2019.  This is resolved and he is no longer on DOAC.  PE was felt to be triggered by recent travel.  Last seen by Dr. Herbie BaltimoreHarding on October 17, 2021.  Was doing well and denied any major issues and no symptoms of palpitations.  He did not have any recurrent PVCs.  Echo in 2020 revealed EF 60 to 65%, grade 1 DD, mild dilation of left ventricle.  Past Doppler of left lower extremity in June 2022 did not reveal lower extremity DVT but revealed subacute to chronic appearing nonocclusive thrombus in the left greater saphenous vein at the level of the knee, mostly simple appearing left Baker's cyst, measuring up to 2.7 cm.   He recently contacted our office on 09/30/22 c/o performance decline at gym, similar to when he last had PE. Experienced SOB with a few minutes of exertion, after resting resumes back to feeling normal. Today he presents for evaluation. He states he wants to get checked out and make sure he doesn't have a PE. Said last time he had quick onset DOE, he quickly progressed with a PE. Said his DOE came on quickly last Saturday. Says he does cardio in gym first thing and said noticed [facility] with minimal exertion, depending on what he was doing. Denies any chest pain, palpitations, tachycardia, syncope, presyncope, dizziness, orthopnea, PND, swelling, significant weight changes, claudication, or bleeding. 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 Denies any blood pressure issues as far as  he knows. Does not check BP at home, but says his wife owns a cuff. Denies any other questions or concerns.   Past Medical History:  Diagnosis Date   Allergic rhinitis    DOE (dyspnea on exertion)    Elevated blood pressure reading    Hyperlipidemia    Nephrolithiasis    Overweight    PAF (paroxysmal atrial fibrillation) (HCC) 12/02/2018   Noted in the setting of what was likely acute on chronic PEs. ->  No recurrent symptoms since being.  No longer on DOAC.   Premature atrial contractions    Pulmonary embolus (HCC) 12/01/2018   With acute cor pulmonale: RV dilation with pressure and volume overload on echo.    Past Surgical History:  Procedure Laterality Date   24-HOUR HOLTER MONITOR  11/2018   Sinus rhythm with PVCs burden of 5.5.  1 3 beat salvo.  Rare PACs.   LOWER EXTREMITY VENOUS DOPPLERS  12/02/2018   Acute DVT involving left peroneal vein.  Cystic structure found in right popliteal fossa.   TRANSTHORACIC ECHOCARDIOGRAM  11/2018   In setting of acute PE:  Mild LVH.  Focal basal hypertrophy.  EF 55 to 60%.  Mild MR.  Severely dilated and hypokinetic RV with severely dilated RA.  Estimated PA pressure 42 mmHg.  No PFO.  Dilated IVC and blunted respirophasic change, consistent with elevated CVP. -->  Suggests acute cor pulmonale   TRANSTHORACIC ECHOCARDIOGRAM  02/03/2019   EF 60-65%.  Mild LVH.  GR 1 DD.  RV normal size and function.  Mild thickening of aortic valve but no stenosis with significant sclerosis.  Mildly dilated aortic root.    Current Medications: Current Meds  Medication Sig   arginine 500 MG tablet Take 500 mg by mouth daily.   Ascorbic Acid (VITAMIN C) 1000 MG tablet Take 1,000 mg by mouth daily.   b complex vitamins tablet Take 1 tablet by mouth daily.   Chromium Picolinate 200 MCG TABS Take by mouth.   diltiazem (CARDIZEM SR) 90 MG 12 hr capsule Take 90 mg by mouth daily.   Glucosamine 500 MG CAPS Take 1 capsule by mouth daily.   Omega-3 Fatty Acids (FISH  OIL) 1000 MG CAPS Take 1 capsule by mouth daily.   rosuvastatin (CRESTOR) 20 MG tablet TAKE 1 TABLET BY MOUTH EVERY DAY   selenium 200 MCG TABS tablet Take 1 tablet by mouth daily.   Turmeric (QC TUMERIC COMPLEX) 500 MG CAPS Take 1 capsule by mouth daily.   vitamin E 100 UNIT capsule Take 1 capsule by mouth daily.     Allergies:   Patient has no known allergies.   Social History   Socioeconomic History   Marital status: Married    Spouse name: Not on file   Number of children: 2   Years of education: Not on file   Highest education level: Not on file  Occupational History    Employer: [employer]    Comment: Works in Corporate treasurer  Tobacco Use   Smoking status: Never   Smokeless tobacco: Never  Substance and Sexual Activity   Alcohol use: Yes    Comment: rare   Drug use: Never   Sexual activity: Not on file  Other Topics Concern   Not on file  Social History Narrative   Lives with his wife.  Very active, usually exercises 3 days a week.   Social Determinants of Health   Financial Resource Strain: Not on file  Food Insecurity: Not on file  Transportation Needs: Not on file  Physical Activity: Not on file  Stress: Not on file  Social Connections: Not on file     Family History: The patient's family history includes Anemia in his father; Asthma in his mother; Atrial fibrillation in an other family member; Hypertension in his mother.  ROS:   Review of Systems  Constitutional: Negative.   HENT: Negative.    Eyes: Negative.   Respiratory:  Positive for shortness of breath. Negative for cough, hemoptysis, sputum production and wheezing.        See HPI.   Cardiovascular: Negative.   Gastrointestinal: Negative.   Genitourinary: Negative.   Musculoskeletal: Negative.   Skin: Negative.   Neurological: Negative.   Endo/Heme/Allergies: Negative.   Psychiatric/Behavioral: Negative.      Please see the history of present illness.    All other systems reviewed and  are negative.  EKGs/Labs/Other Studies Reviewed:    The following studies were reviewed today:   EKG:  EKG is ordered today.  The ekg ordered today demonstrates SR, 66 bpm, with PAC's in pattern of bigeminy, otherwise no acute ischemic changes.   Lower extremity Doppler on left lower extremity on May 31, 2021: 1. No evidence of left lower extremity deep venous thrombosis. 2. Subacute to chronic appearing nonocclusive thrombus in the left greater saphenous vein (this is a superficial vein) at the level of the knee. No evidence of surrounding  thrombophlebitis. 3. Mostly simple appearing left Baker cyst measuring up to 2.7 cm.  2D echocardiogram on February 03, 2019: Left ventricle: Wall thickness was increased in a pattern of mild    LVH. There was mild focal basal hypertrophy of the septum.    Systolic function was normal. The estimated ejection fraction was    in the range of 55% to 60%.  - Aortic valve: Valve area (VTI): 3.76 cm^2. Valve area (Vmean):    3.29 cm^2.  - Mitral valve: There was mild regurgitation.  - Right ventricle: Severely dilated and hypokinetic.  - Right atrium: The atrium was severely dilated.  - Atrial septum: No defect or patent foramen ovale was identified.  - Pulmonary arteries: PA peak pressure: 42 mm Hg (S).   Bilateral lower extremity Doppler on December 02, 2018: Right: There is no evidence of deep vein thrombosis in the lower  extremity. A cystic structure is found in the popliteal fossa.  Left: Findings consistent with acute deep vein thrombosis involving the  left peroneal vein. No cystic structure found in the popliteal fossa.     24-hour Holter monitor on November 28, 2018: Normal sinus rhythm, PVCs with a burden of 5.5%.  1 3 beat salvo.  Rare PACs.  No diary or complaints.  Recent Labs: No results found for requested labs within last 365 days.  Recent Lipid Panel No results found for: "CHOL", "TRIG", "HDL", "CHOLHDL", "VLDL", "LDLCALC",  "LDLDIRECT"   Risk Assessment/Calculations:    CHA2DS2-VASc Score = 1   This indicates a 0.6% annual risk of stroke. The patient's score is based upon: CHF History: 0 HTN History: 0 Diabetes History: 0 Stroke History: 0 Vascular Disease History: 0 Age Score: 1 Gender Score: 0      HYPERTENSION CONTROL Vitals:   10/04/22 1431 10/04/22 1445  BP: (!) 142/88 (!) 140/80    The patient's blood pressure is elevated above target today.  In order to address the patient's elevated BP: Blood pressure will be monitored at home to determine if medication changes need to be made.; Follow up with general cardiology has been recommended.            Physical Exam:    VS:  BP (!) 140/80 (BP Location: Left Arm, Patient Position: Sitting, Cuff Size: Normal)   Pulse 66   Ht 6\' 5"  (1.956 m)   Wt 240 lb 6.4 oz (109 kg)   SpO2 98%   BMI 28.51 kg/m     Wt Readings from Last 3 Encounters:  10/04/22 240 lb 6.4 oz (109 kg)  10/17/21 224 lb 6.4 oz (101.8 kg)  07/26/20 242 lb (109.8 kg)     GEN: Well nourished, well developed in no acute distress HEENT: Normal NECK: No JVD; No carotid bruits CARDIAC: S1/S2, RRR, no murmurs, rubs, gallops; 2+ peripheral pulses throughout, strong and equal bilaterally RESPIRATORY:  Clear to auscultation without rales, wheezing or rhonchi  MUSCULOSKELETAL:  No edema; No deformity  SKIN: Warm and dry NEUROLOGIC:  Alert and oriented x 3 PSYCHIATRIC:  Normal affect   ASSESSMENT:    1. DOE (dyspnea on exertion)   2. PAC (premature atrial contraction)   3. PAF (paroxysmal atrial fibrillation) ([place])   4. History of pulmonary embolus (PE)   5. Hyperlipidemia with target LDL less than 100   6. Elevated blood pressure reading   7. Dyspnea on exertion   8. Premature atrial contractions    PLAN:    In order of problems listed above:  Dyspnea on exertion Reports this occurred recently, last Saturday.  Noticed with minimal exertion while working out at  gym.  This was similar symptoms that he had before diagnosis of PE.  Does not appear ill appearing, not tachycardic, and not hypotensive today.  Less likelihood that this is PE.  Will obtain D-dimer for initial evaluation for PE.  If elevated significantly, plan to arrange CT angio of chest.  We will arrange TTE to assess overall heart function and to assess for any regional wall motion abnormalities.  If regional wall motion abnormalities noted, plan to discuss ischemic evaluation at next follow-up visit.  ED precautions discussed.  2.  Frequent PACs Noted on twelve-lead EKG today, in pattern of bigeminy.  We will obtain the following blood work: BMET, CBC, magnesium, and TSH. Denies any palpitations.   3. PAF Was in the setting of PE in 2019.  No longer on DOAC as CHA2DS2-VASc score was 1.  CHA2DS2-VASc score today is 1.  Therefore, he does not require DOAC.  He is in sinus rhythm on exam.  If he reports any palpitations or tachycardia at follow-up of visit, plan to arrange ZIO monitor.  Continue diltiazem.  4.  History of PE As stated above, he does not show significant signs or symptoms to suspect PE.  He is not ill-appearing, not tachycardic, and is not hypotensive.  Will obtain D-dimer to initially evaluate for any PE.  If significantly elevated, plan to arrange CT angio of chest to rule out PE.  Updating TTE as mentioned above.  No longer on DOAC as he has not had recurrent A-fib.  He had A-fib in the setting of his PE.  CHA2DS2-VASc score of 1, therefore not requiring anticoagulation.  5.  Hyperlipidemia Last lipid panel in April 2023 revealed total cholesterol 197, LDL 111.  He is on Crestor, turmeric, and omega-3 fatty acid.  Continue current medication regimen.  Plan to update fasting lipid panel at next follow-up visit. Heart healthy diet and regular cardiovascular exercise encouraged.   6.  Elevated blood pressure  Initial BP 142/88.  Repeat BP 140/80.  He denies any issues with his blood  pressure at home.  BP log given today and stated he will monitor blood pressure using wife's cuff. Discussed to monitor BP at home at least 2 hours after medications and sitting for 5-10 minutes.  Continue diltiazem. Heart healthy diet and regular cardiovascular exercise encouraged.  If SBP is not at goal at next office visit, plan to initiate low-dose beta-blocker or ACE/ARB.  7. Disposition: Follow-up in 3 weeks with any APP or sooner if anything changes.   Medication Adjustments/Labs and Tests Ordered: Current medicines are reviewed at length with the patient today.  Concerns regarding medicines are outlined above.  Orders Placed This Encounter  Procedures   Basic metabolic panel   CBC   TSH   Magnesium   D-Dimer, Quantitative   EKG 12-Lead   No orders of the defined types were placed in this encounter.   Patient Instructions  Medication Instructions:  No Changes In Medications at this time.  *If you need a refill on your cardiac medications before your next appointment, please call your pharmacy*  Lab Work: BLOOD WORK TODAY  If you have labs (blood work) drawn today and your tests are completely normal, you will receive your results only by: MyChart Message (if you have MyChart) OR A paper copy in the mail If you have any lab test that is abnormal or we  need to change your treatment, we will call you to review the results.  Testing/Procedures: Your physician has requested that you have an echocardiogram. Echocardiography is a painless test that uses sound waves to create images of your heart. It provides your doctor with information about the size and shape of your heart and how well your heart's chambers and valves are working. You may receive an ultrasound enhancing agent through an IV if needed to better visualize your heart during the echo.This procedure takes approximately one hour. There are no restrictions for this procedure. This will take place at the [address]  [address].   Follow-Up: At [facility], you and your health needs are our priority.  As part of our continuing mission to provide you with exceptional heart care, we have created designated Provider Care Teams.  These Care Teams include your primary Cardiologist (physician) and Advanced Practice Providers (APPs -  Physician Assistants and Nurse Practitioners) who all work together to provide you with the care you need, when you need it.  Your next appointment:   3 week(s)  The format for your next appointment:   In Person  Provider:   ANY APP          Signed, Sharlene Dory, NP  10/04/2022 8:55 PM    [facility]

## 2022-10-04 ENCOUNTER — Encounter: Payer: Self-pay | Admitting: Student

## 2022-10-04 ENCOUNTER — Ambulatory Visit: Payer: BC Managed Care – PPO | Attending: Student | Admitting: Nurse Practitioner

## 2022-10-04 ENCOUNTER — Other Ambulatory Visit: Payer: Self-pay

## 2022-10-04 VITALS — BP 140/80 | HR 66 | Ht 77.0 in | Wt 240.4 lb

## 2022-10-04 DIAGNOSIS — Z86711 Personal history of pulmonary embolism: Secondary | ICD-10-CM | POA: Diagnosis not present

## 2022-10-04 DIAGNOSIS — R030 Elevated blood-pressure reading, without diagnosis of hypertension: Secondary | ICD-10-CM | POA: Insufficient documentation

## 2022-10-04 DIAGNOSIS — R0602 Shortness of breath: Secondary | ICD-10-CM

## 2022-10-04 DIAGNOSIS — R0609 Other forms of dyspnea: Secondary | ICD-10-CM | POA: Diagnosis not present

## 2022-10-04 DIAGNOSIS — I491 Atrial premature depolarization: Secondary | ICD-10-CM | POA: Insufficient documentation

## 2022-10-04 DIAGNOSIS — E785 Hyperlipidemia, unspecified: Secondary | ICD-10-CM

## 2022-10-04 DIAGNOSIS — I480 Paroxysmal atrial fibrillation: Secondary | ICD-10-CM

## 2022-10-04 NOTE — Patient Instructions (Signed)
Medication Instructions:  No Changes In Medications at this time.  *If you need a refill on your cardiac medications before your next appointment, please call your pharmacy*  Lab Work: BLOOD WORK TODAY  If you have labs (blood work) drawn today and your tests are completely normal, you will receive your results only by: [place] (if you have MyChart) OR A paper copy in the mail If you have any lab test that is abnormal or we need to change your treatment, we will call you to review the results.  Testing/Procedures: Your physician has requested that you have an echocardiogram. Echocardiography is a painless test that uses sound waves to create images of your heart. It provides your doctor with information about the size and shape of your heart and how well your heart's chambers and valves are working. You may receive an ultrasound enhancing agent through an IV if needed to better visualize your heart during the echo.This procedure takes approximately one hour. There are no restrictions for this procedure. This will take place at the [address], [address].   Follow-Up: At [facility], you and your health needs are our priority.  As part of our continuing mission to provide you with exceptional heart care, we have created designated Provider Care Teams.  These Care Teams include your primary Cardiologist (physician) and Advanced Practice Providers (APPs -  Physician Assistants and Nurse Practitioners) who all work together to provide you with the care you need, when you need it.  Your next appointment:   3 week(s)  The format for your next appointment:   In Person  Provider:   ANY APP

## 2022-10-05 ENCOUNTER — Telehealth: Payer: Self-pay | Admitting: Nurse Practitioner

## 2022-10-05 ENCOUNTER — Emergency Department (HOSPITAL_BASED_OUTPATIENT_CLINIC_OR_DEPARTMENT_OTHER): Payer: BC Managed Care – PPO

## 2022-10-05 ENCOUNTER — Other Ambulatory Visit: Payer: Self-pay

## 2022-10-05 ENCOUNTER — Emergency Department (HOSPITAL_BASED_OUTPATIENT_CLINIC_OR_DEPARTMENT_OTHER)
Admission: EM | Admit: 2022-10-05 | Discharge: 2022-10-05 | Disposition: A | Discharge status: Home / Self Care | Payer: BC Managed Care – PPO | Attending: Emergency Medicine | Admitting: Emergency Medicine

## 2022-10-05 ENCOUNTER — Encounter (HOSPITAL_BASED_OUTPATIENT_CLINIC_OR_DEPARTMENT_OTHER): Payer: Self-pay | Admitting: Emergency Medicine

## 2022-10-05 DIAGNOSIS — R0602 Shortness of breath: Secondary | ICD-10-CM | POA: Insufficient documentation

## 2022-10-05 DIAGNOSIS — M7989 Other specified soft tissue disorders: Secondary | ICD-10-CM | POA: Diagnosis not present

## 2022-10-05 LAB — BASIC METABOLIC PANEL
Anion gap: 9 (ref 5–15)
BUN/Creatinine Ratio: 15 (ref 10–24)
BUN: 18 mg/dL (ref 8–27)
BUN: 20 mg/dL (ref 8–23)
CO2: 25 mmol/L (ref 20–29)
CO2: 25 mmol/L (ref 22–32)
Calcium: 9.7 mg/dL (ref 8.6–10.2)
Calcium: 9.5 mg/dL (ref 8.9–10.3)
Chloride: 99 mmol/L (ref 96–106)
Chloride: 104 mmol/L (ref 98–111)
Creatinine, Ser: 1.23 mg/dL (ref 0.76–1.27)
Creatinine, Ser: 1.22 mg/dL (ref 0.61–1.24)
GFR, Estimated: >60 mL/min (ref >60)
Glucose, Bld: 84 mg/dL (ref 70–99)
Glucose: 96 mg/dL (ref 70–99)
Potassium: 4.6 mmol/L (ref 3.5–5.2)
Potassium: 4 mmol/L (ref 3.5–5.1)
Sodium: 140 mmol/L (ref 134–144)
Sodium: 138 mmol/L (ref 135–145)
eGFR: 64 mL/min/{1.73_m2} (ref >59)

## 2022-10-05 LAB — CBC
Hematocrit: 48.1 % (ref 37.5–51.0)
Hemoglobin: 16 g/dL (ref 13.0–17.7)
MCH: 28.5 pg (ref 26.6–33.0)
MCHC: 33.3 g/dL (ref 31.5–35.7)
MCV: 86 fL (ref 79–97)
Platelets: 251 10*3/uL (ref 150–450)
RBC: 5.61 x10E6/uL (ref 4.14–5.80)
RDW: 13 % (ref 11.6–15.4)
WBC: 7.3 10*3/uL (ref 3.4–10.8)

## 2022-10-05 LAB — TROPONIN I (HIGH SENSITIVITY)
Troponin I (High Sensitivity): 6 ng/L (ref <18)
Troponin I (High Sensitivity): 6 ng/L (ref <18)

## 2022-10-05 LAB — MAGNESIUM: Magnesium: 2.2 mg/dL (ref 1.6–2.3)

## 2022-10-05 LAB — BRAIN NATRIURETIC PEPTIDE: B Natriuretic Peptide: 31.5 pg/mL (ref 0.0–100.0)

## 2022-10-05 LAB — D-DIMER, QUANTITATIVE: D-DIMER: 1.09 mg/L FEU — ABNORMAL HIGH (ref 0.00–0.49)

## 2022-10-05 LAB — TSH: TSH: 2.24 u[IU]/mL (ref 0.450–4.500)

## 2022-10-05 MED ORDER — IOHEXOL 350 MG/ML SOLN
100.0000 mL | Freq: Once | INTRAVENOUS | Status: AC | PRN
  Administered 2022-10-05: 75 mL via INTRAVENOUS

## 2022-10-05 NOTE — ED Notes (Signed)
Discharge instructions discussed with pt. Pt verbalized understanding. Pt stable and ambulatory.  °

## 2022-10-05 NOTE — Telephone Encounter (Signed)
Called and spoke with patient and updated him regarding his lab results from yesterday.  Overall unremarkable, stable blood work except for elevated D-dimer.  Called him at 08:04 this morning and recommended he go to the [place] ED location to get further work-up.  May need to get repeat D-dimer, and if still elevated, will need CT angio of chest performed to rule out PE. He verbalized understanding and was appreciative of my call.   Finis Bud, NP

## 2022-10-05 NOTE — Discharge Instructions (Addendum)
Note the work-up today was overall reassuring.  No evidence of PE or abnormality in the lungs.  No evidence of heart attack.  Recommend follow-up with cardiologist outpatient.  Please not hesitate to return to emergency department the worrisome signs and symptoms we discussed become apparent.

## 2022-10-05 NOTE — ED Provider Notes (Signed)
[facility] EMERGENCY DEPT Provider Note   CSN: [id number] Arrival date & time: 10/05/22  1232     History  Chief Complaint  Patient presents with   Shortness of Breath    Phillip Dalton is a 71 y.o. male.   Shortness of Breath   71 year old male presents emergency department with complaints of shortness of breath.  Patient states that symptoms of shortness of breath have been present for approximately 5 to 6 days.  He states he was working out at [employer] and felt more short of breath than usual with his normal daily routine.  He was seen by his cardiologist yesterday who drew a D-dimer which is elevated to 1.09.  He was sent to the emergency department for further evaluation.  He currently endorses shortness of breath with no chest pain, cough, congestion, fever, chills, night sweats.  Patient states he had a pulmonary embolism back in 2019 and this felt similar to beginnings of symptoms then.  Patient is currently not on anticoagulation.  Past medical history significant for paroxysmal atrial fibrillation, pulmonary embolism, hyperlipidemia, PVC  Home Medications Prior to Admission medications   Medication Sig Start Date End Date Taking? Authorizing Provider  arginine 500 MG tablet Take 500 mg by mouth daily.    [provider]  Ascorbic Acid (VITAMIN C) 1000 MG tablet Take 1,000 mg by mouth daily.    [provider]  b complex vitamins tablet Take 1 tablet by mouth daily.    [provider]  cephALEXin (KEFLEX) 500 MG capsule Take 500 mg by mouth 2 (two) times daily. 10/15/21   [provider]  Chromium Picolinate 200 MCG TABS Take by mouth.    [provider]  diltiazem (CARDIZEM SR) 90 MG 12 hr capsule Take 90 mg by mouth daily. 12/24/18   [provider]  Glucosamine 500 MG CAPS Take 1 capsule by mouth daily.    [provider]  Omega-3 Fatty Acids (FISH OIL) 1000 MG CAPS Take 1 capsule by mouth  daily.    [provider]  rosuvastatin (CRESTOR) 20 MG tablet TAKE 1 TABLET BY MOUTH EVERY DAY 12/15/21   Marykay Lex, MD  selenium 200 MCG TABS tablet Take 1 tablet by mouth daily.    [provider]  Turmeric (QC TUMERIC COMPLEX) 500 MG CAPS Take 1 capsule by mouth daily.    [provider]  vitamin E 100 UNIT capsule Take 1 capsule by mouth daily.    [provider]      Allergies    Patient has no known allergies.    Review of Systems   Review of Systems  Respiratory:  Positive for shortness of breath.   All other systems reviewed and are negative.   Physical Exam Updated Vital Signs BP (!) 146/71   Pulse 64   Temp 98.2 F (36.8 C) (Oral)   Resp 17   SpO2 100%  Physical Exam Vitals and nursing note reviewed.  Constitutional:      General: He is not in acute distress.    Appearance: Normal appearance. He is well-developed.  HENT:     Head: Normocephalic and atraumatic.  Eyes:     Extraocular Movements: Extraocular movements intact.     Conjunctiva/sclera: Conjunctivae normal.     Pupils: Pupils are equal, round, and reactive to light.  Cardiovascular:     Rate and Rhythm: Normal rate and regular rhythm.     Heart sounds: No murmur heard. Pulmonary:  Effort: Pulmonary effort is normal. No respiratory distress.     Breath sounds: Normal breath sounds. No wheezing or rales.  Abdominal:     Palpations: Abdomen is soft.     Tenderness: There is no abdominal tenderness.  Musculoskeletal:        General: No swelling.     Cervical back: Neck supple.     Comments: 0-1+ pitting edema noted bilateral lower extremities which patient states is baseline.  Skin:    General: Skin is warm and dry.     Capillary Refill: Capillary refill takes less than 2 seconds.  Neurological:     Mental Status: He is alert.  Psychiatric:        Mood and Affect: Mood normal.     ED Results / Procedures / Treatments   Labs (all labs ordered are  listed, but only abnormal results are displayed) Labs Reviewed  BASIC METABOLIC PANEL  BRAIN NATRIURETIC PEPTIDE  TROPONIN I (HIGH SENSITIVITY)  TROPONIN I (HIGH SENSITIVITY)    EKG EKG Interpretation  Date/Time:  Thursday October 05 2022 13:07:49 EDT Ventricular Rate:  62 PR Interval:  173 QRS Duration: 99 QT Interval:  431 QTC Calculation: 438 R Axis:   44 Text Interpretation: Sinus rhythm Atrial premature complex Confirmed by Cathren Laine ([id number]) on 10/05/2022 1:15:20 PM  Radiology US Venous Img Lower Bilateral  Result Date: 10/05/2022 CLINICAL DATA:  Bilateral lower extremity swelling and shortness of breath EXAM: BILATERAL LOWER EXTREMITY VENOUS DOPPLER ULTRASOUND TECHNIQUE: Gray-scale sonography with graded compression, as well as color Doppler and duplex ultrasound were performed to evaluate the lower extremity deep venous systems from the level of the common femoral vein and including the common femoral, femoral, profunda femoral, popliteal and calf veins including the posterior tibial, peroneal and gastrocnemius veins when visible. The superficial great saphenous vein was also interrogated. Spectral Doppler was utilized to evaluate flow at rest and with distal augmentation maneuvers in the common femoral, femoral and popliteal veins. COMPARISON:  None Available. FINDINGS: RIGHT LOWER EXTREMITY Common Femoral Vein: No evidence of thrombus. Normal compressibility, respiratory phasicity and response to augmentation. Saphenofemoral Junction: No evidence of thrombus. Normal compressibility and flow on color Doppler imaging. Profunda Femoral Vein: No evidence of thrombus. Normal compressibility and flow on color Doppler imaging. Femoral Vein: No evidence of thrombus. Normal compressibility, respiratory phasicity and response to augmentation. Popliteal Vein: No evidence of thrombus. Normal compressibility, respiratory phasicity and response to augmentation. Calf Veins: No evidence of  thrombus. Normal compressibility and flow on color Doppler imaging. Superficial Great Saphenous Vein: No evidence of thrombus. Normal compressibility. Venous Reflux:  None. Other Findings:  None. LEFT LOWER EXTREMITY Common Femoral Vein: No evidence of thrombus. Normal compressibility, respiratory phasicity and response to augmentation. Saphenofemoral Junction: No evidence of thrombus. Normal compressibility and flow on color Doppler imaging. Profunda Femoral Vein: No evidence of thrombus. Normal compressibility and flow on color Doppler imaging. Femoral Vein: No evidence of thrombus. Normal compressibility, respiratory phasicity and response to augmentation. Popliteal Vein: No evidence of thrombus. Normal compressibility, respiratory phasicity and response to augmentation. Calf Veins: No evidence of thrombus. Normal compressibility and flow on color Doppler imaging. Superficial Great Saphenous Vein: No evidence of thrombus. Normal compressibility. Venous Reflux:  None. Other Findings: Elongated fluid collection in the popliteal fossa measures 5.0 x 1.8 x 1.2 cm. Findings are consistent with a Baker's cyst. IMPRESSION: No evidence of deep venous thrombosis in either lower extremity. Left-sided Baker's cyst. Electronically Signed   By: Malachy Moan  M.D.   On: 10/05/2022 15:59   CT Angio Chest PE W and/or Wo Contrast  Result Date: 10/05/2022 CLINICAL DATA:  Shortness of breath on exertion since 10/21 while he was working out at [employer] EXAM: CT ANGIOGRAPHY CHEST WITH CONTRAST TECHNIQUE: Multidetector CT imaging of the chest was performed using the standard protocol during bolus administration of intravenous contrast. Multiplanar CT image reconstructions and MIPs were obtained to evaluate the vascular anatomy. RADIATION DOSE REDUCTION: This exam was performed according to the departmental dose-optimization program which includes automated exposure control, adjustment of the mA and/or kV according to patient  size and/or use of iterative reconstruction technique. CONTRAST:  16mL OMNIPAQUE IOHEXOL 350 MG/ML SOLN COMPARISON:  Chest radiograph performed earlier on the same date FINDINGS: Cardiovascular: Satisfactory opacification of the pulmonary arteries to the segmental level. No evidence of pulmonary embolism. Normal heart size. No pericardial effusion. Mediastinum/Nodes: No enlarged mediastinal, hilar, or axillary lymph nodes. Thyroid gland, trachea, and esophagus demonstrate no significant findings. Lungs/Pleura: Lungs are clear. No pleural effusion or pneumothorax. Upper Abdomen: No acute abnormality. Musculoskeletal: Diffuse idiopathic skeletal hyperostosis. No acute osseous abnormality. Review of the MIP images confirms the above findings. IMPRESSION: 1. No evidence of pulmonary embolism or acute pulmonary process. 2. Diffuse idiopathic skeletal hyperostosis of the thoracic spine. 3. Mild coronary artery and aortic atherosclerotic calcifications. Aortic Atherosclerosis (ICD10-I70.0). Electronically Signed   By: Larose Hires D.O.   On: 10/05/2022 13:54    Procedures Procedures    Medications Ordered in ED Medications  iohexol (OMNIPAQUE) 350 MG/ML injection 100 mL (75 mLs Intravenous Contrast Given 10/05/22 1334)    ED Course/ Medical Decision Making/ A&P                           Medical Decision Making Amount and/or Complexity of Data Reviewed Labs: ordered. Radiology: ordered.  Risk Prescription drug management.   This patient presents to the ED for concern of shortness of breath, this involves an extensive number of treatment options, and is a complaint that carries with it a high risk of complications and morbidity.  The differential diagnosis includes The causes for shortness of breath include but are not limited to Cardiac (AHF, pericardial effusion and tamponade, arrhythmias, ischemia, etc) Respiratory (COPD, asthma, pneumonia, pneumothorax, primary pulmonary hypertension, PE/VQ  mismatch) Hematological (anemia)  Co morbidities that complicate the patient evaluation  See HPI   Additional history obtained:  Additional history obtained from EMR External records from outside source obtained and reviewed including CBC, CMP, TSH and D-dimer performed yesterday cardiologist.   Lab Tests:  I Ordered, and personally interpreted labs.  The pertinent results include: No electrolyte abnormalities noted.  Renal function within normal limits.  BMP within normal limits.  Initial troponin of 7 with delta negative; no EKG findings negative ischemia so doubt ACS.   Imaging Studies ordered:  I ordered imaging studies including chest x-ray, CT angio chest I independently visualized and interpreted imaging which showed  Chest x-ray: No acute cardiopulmonary abnormalities CT angio chest: No evidence of PE or acute pulmonary process.  Diffuse hepatic skeletal hyperostosis of thoracic spine.  Mild coronary artery and aortic atherosclerosis. I agree with the radiologist interpretation  Cardiac Monitoring: / EKG:  The patient was maintained on a cardiac monitor.  I personally viewed and interpreted the cardiac monitored which showed an underlying rhythm of: Sinus rhythm   Consultations Obtained:  N/a   Problem List / ED Course / Critical interventions /  Medication management  Shortness of breath Reevaluation of the patient showed that the patient stayed the same I have reviewed the patients home medicines and have made adjustments as needed   Social Determinants of Health:  Denies tobacco, illicit drug use    Test / Admission - Considered:  Shortness of breath Vitals signs significant for mild hypertension with a blood pressure 146/71.  Recommend close follow-up with PCP regarding elevation blood pressure.. Otherwise within normal range and stable throughout visit. Laboratory/imaging studies significant for: See above Patient's overall work-up negative today.   Doubt ACS.  Doubt PE.  Doubt dissection.  Doubt pneumonia.  Doubt COPD/asthma exacerbation.  Doubt anemia.  Patient not hypoxic and experiencing no active chest pain.  Further work-up deemed unnecessary at this time in the emergency department.  Patient recommended close follow-up with cardiologist outpatient regarding symptoms.  Treatment plan discussed at length with patient and he acknowledged understand was agreeable to said plan.  Worrisome signs and symptoms were discussed with the patient and the patient understand was agreeable to said plan.  Patient stable upon discharge.        Final Clinical Impression(s) / ED Diagnoses Final diagnoses:  Shortness of breath    Rx / DC Orders ED Discharge Orders     None         Wilnette Kales, [place] 10/05/22 1647    Lajean Saver, MD 10/06/22 [phone]

## 2022-10-05 NOTE — ED Triage Notes (Signed)
Pt arrives to ED with c/o SOB on exertion since 10/21 while he was working out at [employer]. He notes hx of PE. He did see Cards for same yesterday who did labs and d-dimer, plan for TEE. D-Dimer 1.09.

## 2022-10-18 ENCOUNTER — Ambulatory Visit (HOSPITAL_COMMUNITY): Payer: BC Managed Care – PPO | Attending: Cardiology

## 2022-10-18 DIAGNOSIS — R0602 Shortness of breath: Secondary | ICD-10-CM | POA: Diagnosis not present

## 2022-10-18 HISTORY — PX: TRANSTHORACIC ECHOCARDIOGRAM: SHX275

## 2022-10-18 LAB — ECHOCARDIOGRAM COMPLETE
Area-P 1/2: 2.46 cm2
S' Lateral: 3.2 cm

## 2022-10-25 NOTE — Progress Notes (Signed)
Cardiology Office Note:    Date:  10/26/2022   ID:  Phillip Dalton, DOB 1951/09/06, MRN [id number]  PCP:  Phillip Dalton (Inactive)   [facility] Providers Cardiologist:  Phillip Lemma, MD     Referring MD: No ref. provider found   CC: DOE follow-up  History of Present Illness:    Phillip Dalton is a 71 y.o. male with a hx of the following:  Coronary artery calcification, aortic atherosclerosis PAF Hx of PE  PVC's HLD PAC's Elevated BP reading  Previous cardiovascular history of paroxysmal A-fib in setting of PE with cor pulmonale in 2019.  This is resolved and he is no longer on DOAC.  PE was felt to be triggered by recent travel.  Last seen by Dr. Herbie Dalton on October 17, 2021.  Was doing well and denied any major issues and no symptoms of palpitations.  He did not have any recurrent PVCs.  Echo in 2020 revealed EF 60 to 65%, grade 1 DD, mild dilation of left ventricle.  Past Doppler of left lower extremity in June 2022 did not reveal lower extremity DVT but revealed subacute to chronic appearing nonocclusive thrombus in the left greater saphenous vein at the level of the knee, mostly simple appearing left Baker's cyst, measuring up to 2.7 cm.   I last saw this patient in office on 09/30/22 c/o performance decline at gym, similar to when he last had PE. Experienced SOB with a few minutes of exertion, after resting resumes back to feeling normal. Said last time he had quick onset DOE, he quickly progressed with a PE.  Did cardio in gym first thing and said noticed [facility] with minimal exertion, depending on what he was doing. Denied any chest pain, palpitations, tachycardia, syncope, presyncope, dizziness, orthopnea, PND, swelling, significant weight changes, claudication, or bleeding. 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 Denied any blood pressure issues.  D-dimer was obtained that was elevated at 1.09, rest of lab work was  overall normal.  Was advised to go to drop [facility] ED for further work-up and evaluation of elevated D-dimer.  CT angio of chest revealed no PE or acute pulmonary process, diffuse idiopathic skeletal hyperostosis of thoracic spine, with mild coronary artery and aortic atherosclerotic calcifications.  Aortic atherosclerosis also noted.  Venous Dopplers were done to check for DVT in lower extremities and it was negative.  2D echocardiogram was arranged and revealed EF 60 to 65%, mildly leaky mitral valve, calcified aortic valve, mildly dilated aorta measuring 41 mm.  Today he presents for follow-up.  He states he is doing very well, he has been participating in exercise, but has not returned to full cardio workout routine yet.  Denies any recurrent shortness of breath or chest pain.  Also denies any palpitations, syncope, presyncope, dizziness, orthopnea, PND, swelling, acute bleeding, or claudication. Denies any other questions or concerns.   Past Medical History:  Diagnosis Date   Allergic rhinitis    DOE (dyspnea on exertion)    Elevated blood pressure reading    Hyperlipidemia    Nephrolithiasis    Overweight    PAF (paroxysmal atrial fibrillation) (HCC) 12/02/2018   Noted in the setting of what was likely acute on chronic PEs. ->  No recurrent symptoms since being.  No longer on DOAC.   Premature atrial contractions    Pulmonary embolus (HCC) 12/01/2018   With acute cor pulmonale: RV dilation with pressure and volume overload on echo.  Past Surgical History:  Procedure Laterality Date   24-HOUR HOLTER MONITOR  11/2018   Sinus rhythm with PVCs burden of 5.5.  1 3 beat salvo.  Rare PACs.   LOWER EXTREMITY VENOUS DOPPLERS  12/02/2018   Acute DVT involving left peroneal vein.  Cystic structure found in right popliteal fossa.   TRANSTHORACIC ECHOCARDIOGRAM  11/2018   In setting of acute PE:  Mild LVH.  Focal basal hypertrophy.  EF 55 to 60%.  Mild MR.  Severely dilated and hypokinetic RV with  severely dilated RA.  Estimated PA pressure 42 mmHg.  No PFO.  Dilated IVC and blunted respirophasic change, consistent with elevated CVP. -->  Suggests acute cor pulmonale   TRANSTHORACIC ECHOCARDIOGRAM  02/03/2019   EF 60-65%.  Mild LVH.  GR 1 DD.  RV normal size and function.  Mild thickening of aortic valve but no stenosis with significant sclerosis.  Mildly dilated aortic root.    Current Medications: Current Meds  Medication Sig   arginine 500 MG tablet Take 500 mg by mouth daily.   Ascorbic Acid (VITAMIN C) 1000 MG tablet Take 1,000 mg by mouth daily.   b complex vitamins tablet Take 1 tablet by mouth daily.   Chromium Picolinate 200 MCG TABS Take by mouth.   diltiazem (CARDIZEM SR) 90 MG 12 hr capsule Take 90 mg by mouth daily.   Glucosamine 500 MG CAPS Take 1 capsule by mouth daily.   Omega-3 Fatty Acids (FISH OIL) 1000 MG CAPS Take 1 capsule by mouth daily.   rosuvastatin (CRESTOR) 20 MG tablet TAKE 1 TABLET BY MOUTH EVERY DAY   selenium 200 MCG TABS tablet Take 1 tablet by mouth daily.   Turmeric (QC TUMERIC COMPLEX) 500 MG CAPS Take 1 capsule by mouth daily.   vitamin E 100 UNIT capsule Take 1 capsule by mouth daily.     Allergies:   Patient has no known allergies.   Social History   Socioeconomic History   Marital status: Married    Spouse name: Not on file   Number of children: 2   Years of education: Not on file   Highest education level: Not on file  Occupational History    Employer: [employer]    Comment: Works in Corporate treasurer  Tobacco Use   Smoking status: Never   Smokeless tobacco: Never  Substance and Sexual Activity   Alcohol use: Yes    Comment: rare   Drug use: Never   Sexual activity: Not on file  Other Topics Concern   Not on file  Social History Narrative   Lives with his wife.  Very active, usually exercises 3 days a week.   Social Determinants of Health   Financial Resource Strain: Not on file  Food Insecurity: Not on file   Transportation Needs: Not on file  Physical Activity: Not on file  Stress: Not on file  Social Connections: Not on file     Family History: The patient's family history includes Anemia in his father; Asthma in his mother; Atrial fibrillation in an other family member; Hypertension in his mother.  ROS:   Review of Systems  Constitutional: Negative.   HENT: Negative.    Eyes: Negative.   Respiratory: Negative.    Cardiovascular: Negative.   Gastrointestinal: Negative.   Genitourinary: Negative.   Musculoskeletal: Negative.   Skin: Negative.   Neurological: Negative.   Endo/Heme/Allergies: Negative.   Psychiatric/Behavioral: Negative.       Please see the history of  present illness.    All other systems reviewed and are negative.  EKGs/Labs/Other Studies Reviewed:    The following studies were reviewed today:   EKG:  EKG is ordered today.  The ekg ordered today demonstrates SR, 66 bpm, with PAC's in pattern of bigeminy, otherwise no acute ischemic changes.   2D complete echocardiogram on October 18, 2022:  1. Left ventricular ejection fraction, by estimation, is 60 to 65%. The  left ventricle has normal function. The left ventricle has no regional  wall motion abnormalities. Left ventricular diastolic parameters were  normal. The average left ventricular  global longitudinal strain is -20.0 %. The global longitudinal strain is  normal.   2. Right ventricular systolic function is normal. The right ventricular  size is normal.   3. Left atrial size was mildly dilated.   4. Right atrial size was mildly dilated.   5. The mitral valve is normal in structure. Mild mitral valve  regurgitation. No evidence of mitral stenosis.   6. The aortic valve is tricuspid. Aortic valve regurgitation is not  visualized. Aortic valve sclerosis is present, with no evidence of aortic  valve stenosis.   7. Aortic dilatation noted. There is mild dilatation of the aortic root,  measuring 41  mm.   8. The inferior vena cava is normal in size with greater than 50%  respiratory variability, suggesting right atrial pressure of 3 mmHg.  Lower extremity venous Dopplers on October 05, 2022: No evidence of DVT in either lower extremity.  Left-sided Baker's cyst.  CT angio chest (PE) on October 05, 2022: 1. No evidence of pulmonary embolism or acute pulmonary process. 2. Diffuse idiopathic skeletal hyperostosis of the thoracic spine. 3. Mild coronary artery and aortic atherosclerotic calcifications.   Aortic Atherosclerosis (ICD10-I70.0).  Lower extremity Doppler on left lower extremity on May 31, 2021: 1. No evidence of left lower extremity deep venous thrombosis. 2. Subacute to chronic appearing nonocclusive thrombus in the left greater saphenous vein (this is a superficial vein) at the level of the knee. No evidence of surrounding thrombophlebitis. 3. Mostly simple appearing left Baker cyst measuring up to 2.7 cm.  2D echocardiogram on February 03, 2019: Left ventricle: Wall thickness was increased in a pattern of mild    LVH. There was mild focal basal hypertrophy of the septum.    Systolic function was normal. The estimated ejection fraction was    in the range of 55% to 60%.  - Aortic valve: Valve area (VTI): 3.76 cm^2. Valve area (Vmean):    3.29 cm^2.  - Mitral valve: There was mild regurgitation.  - Right ventricle: Severely dilated and hypokinetic.  - Right atrium: The atrium was severely dilated.  - Atrial septum: No defect or patent foramen ovale was identified.  - Pulmonary arteries: PA peak pressure: 42 mm Hg (S).   Bilateral lower extremity Doppler on December 02, 2018: Right: There is no evidence of deep vein thrombosis in the lower  extremity. A cystic structure is found in the popliteal fossa.  Left: Findings consistent with acute deep vein thrombosis involving the  left peroneal vein. No cystic structure found in the popliteal fossa.     24-hour Holter  monitor on November 28, 2018: Normal sinus rhythm, PVCs with a burden of 5.5%.  1 3 beat salvo.  Rare PACs.  No diary or complaints.  Recent Labs: 10/04/2022: Hemoglobin 16.0; Magnesium 2.2; Platelets 251; TSH 2.240 10/05/2022: B Natriuretic Peptide 31.5; BUN 20; Creatinine, Ser 1.22; Potassium 4.0; Sodium  138  Recent Lipid Panel No results found for: "CHOL", "TRIG", "HDL", "CHOLHDL", "VLDL", "LDLCALC", "LDLDIRECT"   Risk Assessment/Calculations:    CHA2DS2-VASc Score = 1   This indicates a 0.6% annual risk of stroke. The patient's score is based upon: CHF History: 0 HTN History: 0 Diabetes History: 0 Stroke History: 0 Vascular Disease History: 0 Age Score: 1 Gender Score: 0         Physical Exam:    VS:  BP (!) 144/82   Pulse 76   Ht 6\' 5"  (1.956 m)   Wt 236 lb 12.8 oz (107.4 kg)   SpO2 98%   BMI 28.08 kg/m     Wt Readings from Last 3 Encounters:  10/26/22 236 lb 12.8 oz (107.4 kg)  10/04/22 240 lb 6.4 oz (109 kg)  10/17/21 224 lb 6.4 oz (101.8 kg)     GEN: Well nourished, well developed 71 y.o. Caucasian male in no acute distress HEENT: Normal NECK: No JVD; No carotid bruits CARDIAC: S1/S2, RRR, no murmurs, rubs, gallops; 2+ peripheral pulses throughout, strong and equal bilaterally RESPIRATORY:  Clear and diminished to auscultation without rales, wheezing or rhonchi  MUSCULOSKELETAL:  No edema; No deformity  SKIN: Warm and dry NEUROLOGIC:  Alert and oriented x 3 PSYCHIATRIC:  Normal affect   ASSESSMENT:    1. DOE (dyspnea on exertion)   2. Coronary artery calcification   3. Aortic atherosclerosis (HCC)   4. PAF (paroxysmal atrial fibrillation) (HCC)   5. History of pulmonary embolus (PE)   6. Hyperlipidemia with target LDL less than 100   7. Elevated blood pressure reading    PLAN:    In order of problems listed above:  Dyspnea on exertion This has resolved.  CT angio chest did not reveal any PE.  Recent echocardiogram reassuring with a EF 60 to  65%.  He denies any chest pain or any concerning symptoms, therefore will defer ischemic evaluation at this time.  However if this returns after resuming regular cardio exercise, I discussed with patient he needs to reach out to office and consider initiating ischemic evaluation.   2. Coronary artery calcification, aortic atherosclerosis Mild coronary artery and aortic atherosclerotic calcifications noted on CT angio on October 05, 2022. Stable with no anginal symptoms. No indication for ischemic evaluation. Ischemic evaluation will be considered and most likely pursued if his dyspnea on exertion resumes after resuming cardio exercise or if he has any worsening shortness of breath.  Continue Crestor and initiate Zetia. Heart healthy diet and regular cardiovascular exercise encouraged.   3. PAF Was in the setting of PE in 2019.  No longer on DOAC as CHA2DS2-VASc score was 1.  CHA2DS2-VASc score today is 1.  Does not require DOAC.  He is in sinus rhythm on exam.  If he reports any palpitations or tachycardia at future follow-up of visit, plan to arrange ZIO monitor.  Continue diltiazem.  4.  History of PE Recent CT angio chest was negative for PE.  Shortness of breath on exertion has resolved.  No longer on DOAC as he has not had recurrent A-fib.  He had A-fib in the setting of his PE.  CHA2DS2-VASc score of 1, therefore not requiring anticoagulation.  5.  Hyperlipidemia Last lipid panel in April 2023 revealed total cholesterol 197, LDL 111.  He is on turmeric, Crestor, and omega-3 fatty acid.  Initiate Zetia 10 mg daily to further lower LDL to less than 100. Obtain fasting lipid panel and LFT in 2 months.  Heart healthy diet and regular cardiovascular exercise encouraged.   6.  Elevated blood pressure  BP today elevated at 144/82. Discussed to monitor BP at home at least 2 hours after medications and sitting for 5-10 minutes and let me know daily readings in the next 2 weeks via MyChart.  Continue  diltiazem. Heart healthy diet and regular cardiovascular exercise encouraged.  If SBP is not at goal within next 2 weeks, plan to initiate low-dose beta-blocker or ACE/ARB. Heart healthy diet and regular cardiovascular exercise encouraged.   7. Disposition: Follow-up Dr. Herbie Dalton in 4 to 5 months or sooner if anything changes.  Medication Adjustments/Labs and Tests Ordered: Current medicines are reviewed at length with the patient today.  Concerns regarding medicines are outlined above.  Orders Placed This Encounter  Procedures   Lipid panel   Hepatic function panel   Meds ordered this encounter  Medications   ezetimibe (ZETIA) 10 MG tablet    Sig: Take 1 tablet (10 mg total) by mouth daily.    Dispense:  30 tablet    Refill:  6    Patient Instructions  Medication Instructions:  START EZETIMIBE (ZETIA) 10MG  DAILY *If you need a refill on your cardiac medications before your next appointment, please call your pharmacy*   Lab Work: FASTING LIPID AND LFT IN 2 MONTHS If you have labs (blood work) drawn today and your tests are completely normal, you will receive your results only by: MyChart Message (if you have MyChart) OR  A paper copy in the mail  If you have any lab test that is abnormal or we need to change your treatment, we will call you to review the results.  Testing/Procedures: NONE  Follow-Up: At [facility], you and your health needs are our priority.  As part of our continuing mission to provide you with exceptional heart care, we have created designated Provider Care Teams.  These Care Teams include your primary Cardiologist (physician) and Advanced Practice Providers (APPs -  Physician Assistants and Nurse Practitioners) who all work together to provide you with the care you need, when you need it.  Your next appointment:   4-5 month(s)  The format for your next appointment:   In Person  Provider:   [facility], MD    Other  Instructions   Important Information About Sugar       Signed, Phillip Lemma, NP  10/26/2022 1:09 PM    [facility]

## 2022-10-25 NOTE — Progress Notes (Unsigned)
Error

## 2022-10-26 ENCOUNTER — Ambulatory Visit: Payer: BC Managed Care – PPO | Attending: General Practice | Admitting: Nurse Practitioner

## 2022-10-26 ENCOUNTER — Encounter: Payer: Self-pay | Admitting: General Practice

## 2022-10-26 VITALS — BP 144/82 | HR 76 | Ht 77.0 in | Wt 236.8 lb

## 2022-10-26 DIAGNOSIS — R030 Elevated blood-pressure reading, without diagnosis of hypertension: Secondary | ICD-10-CM

## 2022-10-26 DIAGNOSIS — R0609 Other forms of dyspnea: Secondary | ICD-10-CM | POA: Diagnosis not present

## 2022-10-26 DIAGNOSIS — Z86711 Personal history of pulmonary embolism: Secondary | ICD-10-CM

## 2022-10-26 DIAGNOSIS — I2510 Atherosclerotic heart disease of native coronary artery without angina pectoris: Secondary | ICD-10-CM

## 2022-10-26 DIAGNOSIS — I700 Atherosclerosis of aorta: Secondary | ICD-10-CM | POA: Diagnosis not present

## 2022-10-26 DIAGNOSIS — I480 Paroxysmal atrial fibrillation: Secondary | ICD-10-CM

## 2022-10-26 DIAGNOSIS — E785 Hyperlipidemia, unspecified: Secondary | ICD-10-CM

## 2022-10-26 DIAGNOSIS — I2584 Coronary atherosclerosis due to calcified coronary lesion: Secondary | ICD-10-CM

## 2022-10-26 MED ORDER — EZETIMIBE 10 MG PO TABS: 10.0000 mg | ORAL_TABLET | Freq: Every day | ORAL | 6 refills | Status: DC

## 2022-10-26 NOTE — Patient Instructions (Signed)
Medication Instructions:  START EZETIMIBE (ZETIA) 10MG  DAILY *If you need a refill on your cardiac medications before your next appointment, please call your pharmacy*   Lab Work: FASTING LIPID AND LFT IN 2 MONTHS If you have labs (blood work) drawn today and your tests are completely normal, you will receive your results only by: MyChart Message (if you have MyChart) OR  A paper copy in the mail  If you have any lab test that is abnormal or we need to change your treatment, we will call you to review the results.  Testing/Procedures: NONE  Follow-Up: At [facility], you and your health needs are our priority.  As part of our continuing mission to provide you with exceptional heart care, we have created designated Provider Care Teams.  These Care Teams include your primary Cardiologist (physician) and Advanced Practice Providers (APPs -  Physician Assistants and Nurse Practitioners) who all work together to provide you with the care you need, when you need it.  Your next appointment:   4-5 month(s)  The format for your next appointment:   In Person  Provider:   [facility], MD    Other Instructions   Important Information About Sugar

## 2023-01-19 ENCOUNTER — Encounter: Payer: Self-pay | Admitting: Cardiology

## 2023-01-19 ENCOUNTER — Ambulatory Visit: Payer: BC Managed Care – PPO | Attending: Cardiology | Admitting: Cardiology

## 2023-01-19 VITALS — BP 128/78 | HR 65 | Ht 77.0 in | Wt 239.6 lb

## 2023-01-19 DIAGNOSIS — I2584 Coronary atherosclerosis due to calcified coronary lesion: Secondary | ICD-10-CM | POA: Diagnosis not present

## 2023-01-19 DIAGNOSIS — I491 Atrial premature depolarization: Secondary | ICD-10-CM | POA: Diagnosis not present

## 2023-01-19 DIAGNOSIS — I480 Paroxysmal atrial fibrillation: Secondary | ICD-10-CM | POA: Diagnosis not present

## 2023-01-19 DIAGNOSIS — R030 Elevated blood-pressure reading, without diagnosis of hypertension: Secondary | ICD-10-CM

## 2023-01-19 DIAGNOSIS — I2609 Other pulmonary embolism with acute cor pulmonale: Secondary | ICD-10-CM

## 2023-01-19 DIAGNOSIS — I2510 Atherosclerotic heart disease of native coronary artery without angina pectoris: Secondary | ICD-10-CM | POA: Diagnosis not present

## 2023-01-19 DIAGNOSIS — E785 Hyperlipidemia, unspecified: Secondary | ICD-10-CM

## 2023-01-19 NOTE — Patient Instructions (Addendum)
Medication Instructions:  No  changes  *If you need a refill on your cardiac medications before your next appointment, please call your pharmacy*   Lab Work: Not needed    Testing/Procedures:    Follow-Up: At [facility], you and your health needs are our priority.  As part of our continuing mission to provide you with exceptional heart care, we have created designated Provider Care Teams.  These Care Teams include your primary Cardiologist (physician) and Advanced Practice Providers (APPs -  Physician Assistants and Nurse Practitioners) who all work together to provide you with the care you need, when you need it.     Your next appointment:   12 month(s)  The format for your next appointment:   In Person  Provider:   Glenetta Hew, MD

## 2023-01-19 NOTE — Progress Notes (Signed)
Primary Care Provider: Garth Bigness (Inactive) [facility] Cardiologist: Glenetta Hew, MD Electrophysiologist: None  Clinic Note: Chief Complaint  Patient presents with   Follow-up    Doing much better.  No further issues.   ===================================  ASSESSMENT/PLAN   Problem List Items Addressed This Visit       Cardiology Problems   Pulmonary embolism with acute cor pulmonale (HCC) (Chronic)    History of PE.  Doing well otherwise.  Follow-up echocardiogram showed definite improvement of the ranges from PE with normalization overall.      Premature atrial contractions (Chronic)    Noted on EKG but not overly symptomatic.  He is on diltiazem.  Clearly he does have the potential for tach and A-fib, but has not had a true arrhythmia symptoms since his PE.  Monitor closely.  It is possible that exertional dyspnea back in October was related to being in A-fib, but he did have any untoward symptoms.  No EKG suggested A-fib.      PAF (paroxysmal atrial fibrillation) (HCC) -not on anticoagulation due to CHA2DS2-VASc score of 1 (09/2022) - Primary (Chronic)    Likely his initial episode was triggered by PE with no recurrent symptoms.  Has not had any regular heartbeats or palpitations to speak of.  We decided to stop DOAC after completion of therapy for PE. CHA2DS2-VASc score is only 1 therefore we are not electing to anticoagulate.  I do think 81 mg aspirin is reasonable. He is on low-dose diltiazem which is adequate for blood pressure control as well as baseline rate control.      Relevant Orders   EKG 12-Lead (Completed)   Hyperlipidemia with target LDL less than 100 (Chronic)    He seems to doing well Crestor 20 mg daily with LDL not quite where would like to be.  Target would be less than 100.  Low threshold to consider titrating up further to a full 40 mg daily.  Defer to PCP      Coronary artery calcification (Chronic)    Coronary artery  calcification noted on CT scan, but no active symptoms.  At present, in the absence of concerning symptoms I think we will hold off on more detailed evaluation although it does stressed importance of treating his factors of hypertension, and hyperlipidemia.  He is already on statin and we are targeting LDL less than 100.  His blood pressure is well-controlled.  Very active with no ongoing symptoms.  Continue risk factor modification.      Relevant Orders   EKG 12-Lead (Completed)     Other   Elevated blood pressure reading    Has not had an elevated blood pressure reading here.  He is on diltiazem more because of underlying potential A-fib.       ===================================  HPI:    Phillip Dalton is a 72 y.o. male with a PMH notable for PAF in setting of PE with Cor Pulmonale (December 2019), frequent PVCs, and HLD who presents today for follow-up.  I last saw Phillip Dalton back in November 2022 for delayed annual follow-up.  He was doing well.  Tolerating rosuvastatin no symptoms of irregular or rapid heartbeats..  Nothing to suggest recurrent A-fib.  Very active exercising least 2 to 3 days a week.  Walks a lot at work.-No longer on DOAC.  CHA2DS2-VASc score was 1 but had no recurrence of A-fib since PE.  Stable PVCs, asymptomatic.  He was seen here on October 04, 2022 by  Finis Bud, NP with complaints of performance declined to the gym.  This was similar to symptoms that he happily has PE.  Exertional dyspnea with venous exertion.  Symptoms began relatively acutely and has been ongoing.  Was noticing exertional dyspnea with minimal exertion but no chest pain or palpitations.  No BP issues. => D-dimer ordered along with 2D echo.  Recent Hospitalizations:  10/05/2022-[facility] -presented with dyspnea.  D-dimer was 1.09 and therefore sent to the ER.  EKG showed sinus rhythm with PACs. => CTA chest ordered with no evidence of PE.  Mild coronary artery  and aortic atherosclerotic calcification noted. Lower extremity venous Doppler which showed no DVT but did show left-sided Baker's cyst.  Brodin was seen on November 16 for follow-up after PE protocol CT and echo that were both relatively normal.  Interestingly, by this time, he was back to exercising, but not yet full cardio.  No chest pain or pressure.  No dyspnea.  Suggestion was to consider ischemic evaluation if symptoms recur.  Reviewed  CV studies:    The following studies were reviewed today: (if available, images/films reviewed: From Epic Chart or Care Everywhere) TTE 10/18/2022: EF 60 to 65%.  Normal LV function.  No RWMA.  Normal diastolic parameters.  Mild LA and RA dilation.  Normal aortic and mitral valves.  Normal RAP.  Interval History:   Phillip Dalton returns here today for close follow-up actually stated that he is pretty much back to full exercise level in the gym.  Exertional dyspnea seems to be doing fairly well.  He does not recall having been ill or otherwise unhealthy back in October, but must just had an bad couple weeks.  He has not had any further symptoms of exertional dyspnea, chest pain or pressure with rest or exertion.  Back to full activity feeling fine.  No unilateral swelling despite the Baker's cyst.  Not that he would know what A-fib feels like in the absence of PE, but he has not noticed any rapid irregular heartbeats palpitations.  CV Review of Symptoms (Summary): no chest pain or dyspnea on exertion negative for - edema, irregular heartbeat, orthopnea, palpitations, paroxysmal nocturnal dyspnea, rapid heart rate, shortness of breath, or lightheadedness, dizziness or wooziness, syncope/near syncope or TIA/amaurosis fugax, claudication  REVIEWED OF SYSTEMS   Review of Systems  Constitutional:  Negative for malaise/fatigue (Back to normal exercise tolerance) and weight loss.  HENT:  Negative for congestion.   Respiratory:  Negative for cough and  shortness of breath.   Cardiovascular:  Negative for leg swelling.  Gastrointestinal:  Negative for blood in stool and melena.  Genitourinary:  Negative for hematuria.  Musculoskeletal:  Negative for joint pain and myalgias.  Neurological:  Negative for dizziness and focal weakness.  Psychiatric/Behavioral: Negative.      I have reviewed and (if needed) personally updated the patient's problem list, medications, allergies, past medical and surgical history, social and family history.   PAST MEDICAL HISTORY   Past Medical History:  Diagnosis Date   Allergic rhinitis    DOE (dyspnea on exertion)    Elevated blood pressure reading    Hyperlipidemia    Nephrolithiasis    Overweight    PAF (paroxysmal atrial fibrillation) ([facility]) 12/02/2018   Noted in the setting of what was likely acute on chronic PEs. ->  No recurrent symptoms since being.  No longer on DOAC.   Premature atrial contractions    Pulmonary embolus ([place]) 12/01/2018   With acute cor  pulmonale: RV dilation with pressure and volume overload on echo.    PAST SURGICAL HISTORY   Past Surgical History:  Procedure Laterality Date   24-HOUR HOLTER MONITOR  11/2018   Sinus rhythm with PVCs burden of 5.5.  1 3 beat salvo.  Rare PACs.   LOWER EXTREMITY VENOUS DOPPLERS  12/02/2018   Acute DVT involving left peroneal vein.  Cystic structure found in right popliteal fossa.   TRANSTHORACIC ECHOCARDIOGRAM  11/2018   In setting of acute PE:  Mild LVH.  Focal basal hypertrophy.  EF 55 to 60%.  Mild MR.  Severely dilated and hypokinetic RV with severely dilated RA.  Estimated PA pressure 42 mmHg.  No PFO.  Dilated IVC and blunted respirophasic change, consistent with elevated CVP. -->  Suggests acute cor pulmonale   TRANSTHORACIC ECHOCARDIOGRAM  10/18/2022   EF 60 to 65%.  Normal LV function.  No RWMA.  Normal diastolic parameters.  Mild LA and RA dilation.  Normal aortic and mitral valves.  Normal RAP.    MEDICATIONS/ALLERGIES    Current Meds  Medication Sig   arginine 500 MG tablet Take 500 mg by mouth daily.   Ascorbic Acid (VITAMIN C) 1000 MG tablet Take 1,000 mg by mouth daily.   b complex vitamins tablet Take 1 tablet by mouth daily.   Chromium Picolinate 200 MCG TABS Take by mouth.   diltiazem (CARDIZEM SR) 90 MG 12 hr capsule Take 90 mg by mouth daily.   ezetimibe (ZETIA) 10 MG tablet Take 1 tablet (10 mg total) by mouth daily.   Glucosamine 500 MG CAPS Take 1 capsule by mouth daily.   Omega-3 Fatty Acids (FISH OIL) 1000 MG CAPS Take 1 capsule by mouth daily.   rosuvastatin (CRESTOR) 20 MG tablet TAKE 1 TABLET BY MOUTH EVERY DAY   selenium 200 MCG TABS tablet Take 1 tablet by mouth daily.   Turmeric (QC TUMERIC COMPLEX) 500 MG CAPS Take 1 capsule by mouth daily.   vitamin E 100 UNIT capsule Take 1 capsule by mouth daily.    No Known Allergies  SOCIAL HISTORY/FAMILY HISTORY   Reviewed in Epic:  Pertinent findings:  Social History   Tobacco Use   Smoking status: Never   Smokeless tobacco: Never  Substance Use Topics   Alcohol use: Yes    Comment: rare   Drug use: Never   Social History   Social History Narrative   Lives with his wife.  Very active, usually exercises 3 days a week.    OBJCTIVE -PE, EKG, labs   Wt Readings from Last 3 Encounters:  01/19/23 239 lb 9.6 oz (108.7 kg)  10/26/22 236 lb 12.8 oz (107.4 kg)  10/04/22 240 lb 6.4 oz (109 kg)    Physical Exam: BP 128/78   Pulse 65   Ht 6' 5"$  (1.956 m)   Wt 239 lb 9.6 oz (108.7 kg)   SpO2 97%   BMI 28.41 kg/m  Physical Exam Vitals reviewed.  Constitutional:      General: He is not in acute distress.    Appearance: Normal appearance. He is normal weight. He is not ill-appearing or toxic-appearing.     Comments: Well-nourished, well-groomed.  Healthy-appearing.  HENT:     Head: Normocephalic and atraumatic.  Neck:     Vascular: No carotid bruit or JVD.  Cardiovascular:     Rate and Rhythm: Normal rate and regular  rhythm. No extrasystoles are present.    Chest Wall: PMI is not displaced.  Pulses: Normal pulses and intact distal pulses.     Heart sounds: S1 normal and S2 normal. No murmur heard.    No friction rub. No gallop.  Pulmonary:     Effort: Pulmonary effort is normal. No respiratory distress.     Breath sounds: Normal breath sounds. No wheezing, rhonchi or rales.  Musculoskeletal:        General: No swelling. Normal range of motion.     Cervical back: Normal range of motion and neck supple.  Skin:    General: Skin is warm and dry.  Neurological:     General: No focal deficit present.     Mental Status: He is alert and oriented to person, place, and time. Mental status is at baseline.     Gait: Gait normal.  Psychiatric:        Mood and Affect: Mood normal.        Behavior: Behavior normal.        Thought Content: Thought content normal.        Judgment: Judgment normal.     Adult ECG Report  Rate: 65 ;  Rhythm: normal sinus rhythm, premature ventricular contractions (PVC), and normal axis, intervals, durations. Normal Voltage. ;   Narrative Interpretation: ~normal  Recent Labs:  reviewed  03/27/2022: TC 197, TG 67, HDL 74, LDL 111 No results found for: "CHOL", "HDL", "LDLCALC", "LDLDIRECT", "TRIG", "CHOLHDL" Lab Results  Component Value Date   CREATININE 1.22 10/05/2022   BUN 20 10/05/2022   NA 138 10/05/2022   K 4.0 10/05/2022   CL 104 10/05/2022   CO2 25 10/05/2022      Latest Ref Rng & Units 10/04/2022    3:29 PM 03/10/2020   12:20 PM 12/05/2018    3:21 AM  CBC  WBC 3.4 - 10.8 x10E3/uL 7.3  7.8  13.5   Hemoglobin 13.0 - 17.7 g/dL 16.0  15.6  13.5   Hematocrit 37.5 - 51.0 % 48.1  44.0  39.5   Platelets 150 - 450 x10E3/uL 251  234  292     No results found for: "HGBA1C" Lab Results  Component Value Date   TSH 2.240 10/04/2022    ================================================== I spent a total of 19 min with the patient spent in direct patient consultation.   Additional time spent with chart review  / charting (studies, outside notes, etc): 14 min Total Time: 33 min  Current medicines are reviewed at length with the patient today.  (+/- concerns) n/a  Notice: This dictation was prepared with Dragon dictation along with smart phrase technology. Any transcriptional errors that result from this process are unintentional and may not be corrected upon review.  Studies Ordered:   Orders Placed This Encounter  Procedures   EKG 12-Lead   No orders of the defined types were placed in this encounter.   Patient Instructions / Medication Changes & Studies & Tests Ordered   Patient Instructions  Medication Instructions:  No  changes  *If you need a refill on your cardiac medications before your next appointment, please call your pharmacy*   Lab Work: Not needed    Testing/Procedures:    Follow-Up: At [facility], you and your health needs are our priority.  As part of our continuing mission to provide you with exceptional heart care, we have created designated Provider Care Teams.  These Care Teams include your primary Cardiologist (physician) and Advanced Practice Providers (APPs -  Physician Assistants and Nurse Practitioners) who all work together to provide  you with the care you need, when you need it.     Your next appointment:   12 month(s)  The format for your next appointment:   In Person  Provider:   Glenetta Hew, MD      Leonie Man, MD, MS Glenetta Hew, M.D., M.S. Interventional Cardiologist  [facility]  Pager # [phone] Phone # [phone] [address] [place], [place] [address]   Thank you for choosing [facility]!!

## 2023-01-30 ENCOUNTER — Encounter: Payer: Self-pay | Admitting: Cardiology

## 2023-01-30 DIAGNOSIS — I2510 Atherosclerotic heart disease of native coronary artery without angina pectoris: Secondary | ICD-10-CM | POA: Insufficient documentation

## 2023-01-30 NOTE — Assessment & Plan Note (Signed)
Likely his initial episode was triggered by PE with no recurrent symptoms.  Has not had any regular heartbeats or palpitations to speak of.  We decided to stop DOAC after completion of therapy for PE. CHA2DS2-VASc score is only 1 therefore we are not electing to anticoagulate.  I do think 81 mg aspirin is reasonable. He is on low-dose diltiazem which is adequate for blood pressure control as well as baseline rate control.

## 2023-01-30 NOTE — Assessment & Plan Note (Signed)
Has not had an elevated blood pressure reading here.  He is on diltiazem more because of underlying potential A-fib.

## 2023-01-30 NOTE — Assessment & Plan Note (Signed)
History of PE.  Doing well otherwise.  Follow-up echocardiogram showed definite improvement of the ranges from PE with normalization overall.

## 2023-01-30 NOTE — Assessment & Plan Note (Signed)
He seems to doing well Crestor 20 mg daily with LDL not quite where would like to be.  Target would be less than 100.  Low threshold to consider titrating up further to a full 40 mg daily.  Defer to PCP

## 2023-01-30 NOTE — Assessment & Plan Note (Signed)
Noted on EKG but not overly symptomatic.  He is on diltiazem.  Clearly he does have the potential for tach and A-fib, but has not had a true arrhythmia symptoms since his PE.  Monitor closely.  It is possible that exertional dyspnea back in October was related to being in A-fib, but he did have any untoward symptoms.  No EKG suggested A-fib.

## 2023-01-30 NOTE — Assessment & Plan Note (Signed)
Coronary artery calcification noted on CT scan, but no active symptoms.  At present, in the absence of concerning symptoms I think we will hold off on more detailed evaluation although it does stressed importance of treating his factors of hypertension, and hyperlipidemia.  He is already on statin and we are targeting LDL less than 100.  His blood pressure is well-controlled.  Very active with no ongoing symptoms.  Continue risk factor modification.

## 2023-02-06 ENCOUNTER — Other Ambulatory Visit: Payer: Self-pay | Admitting: Cardiology

## 2023-05-03 ENCOUNTER — Telehealth: Payer: Self-pay | Admitting: Cardiology

## 2023-05-03 NOTE — Telephone Encounter (Signed)
Patient states his BP has started to be elevatted.  He does not have a log and the 140/80 was at his last appt.  The 160/78 wazs just recent.  He is advised to keep a log of BP and HR up until appt.  If his diastolic gets to 16'X please call or if continues to rise overall, to call office before appt to let us know.  He states understanding.

## 2023-05-03 NOTE — Telephone Encounter (Signed)
LVM to call our office to discuss BP

## 2023-05-03 NOTE — Telephone Encounter (Signed)
Per patient schedule message:  "BP has increased from our last visit.  It was around 140/80 and now it has risen to 160/78 as of the past few days as far as I know."

## 2023-05-04 NOTE — Progress Notes (Deleted)
Cardiology Office Note:    Date:  05/04/2023   ID:  Phillip Dalton, DOB 01/18/1951, MRN [id number]  PCP:  Phillip Rainbow, PA-C (Inactive)  Cardiologist:  Phillip Lemma, MD  Electrophysiologist:  None   Referring MD: No ref. provider found   Chief Complaint: hypertension  History of Present Illness:    Phillip Dalton is a 72 y.o. male with a history of  coronary artery calcifications noted on chest CTA in 09/2022, PE with acute cor pulmonale in 11/2018, paroxysmal atrial fibrillation in setting of acute PE no longer on anticoagulation, DVT in 11/2018, PVCs, and hyperlipidemia who is followed by Dr. Herbie Dalton and presents today for evaluation of hypertension.  Patient was admitted in in 11/2018 with bilateral severe occlusive PE with CT evidence of right heart strain and pulmonary infarct. Also found to have acute DVT involving the left peroneal vein. Echo showed LVEF of 55-60% with mild focal basal hypertrophy and severely dilated and hypokinetic RV as well as mild MR. He was also noted to be in new onset atrial fibrillation during admission but converted to sinus rhythm after being started on Cardizem. Of note, outpatient monitor shortly before this hospitalization showed no evidence of atrial fibrillation. Arrhythmia was felt to be due to his PE. DVT and PE were felt to be triggered by recent travel. He was started on Eliquis. Repeat Echo in 01/2019 showed normalization of RV function. He had no documented recurrence of atrial fibrillation so anticoagulation was not continued after treatment of acute PE/ DVT.   At visit in 09/2022, patient reported performance decline at the gym and some shortness of breath and he was worried that he may have another PE. D-dimer was elevated so chest CTA was performed and was negative for PE but did show mild coronary artery and aortic atherosclerotic calcifications.  Lower extremity dopplers at this time were also negative for DVT. Echo in 10/2022  showed LVEF of 60-65% with normal wall motion and diastolic parameters, normal RV, mild MR, and mildly dilated aortic root measuring 41mm.  Patient called our office on 05/03/2023 with reports of elevated BP. Therefore, this visit was arranged for further evaluation.   Hypertension *** - Continue Cardizem SR 90mg  daily.  - ***  Coronary Artery Calcifications Chest CTA in 09/2022 showed mild coronary artery and aortic atherosclerotic calcifications.  - No chest pain.  - Recommend starting Aspirin 81mg  daily. - Continue Crestor 20mg  daily and Zetia 10mg  daily.   Paroxysmal Atrial Fibrillation Occurred in the setting of acute PE with RV strain. He has had no documented recurrence since then so is no longer on anticoagulation.  - Maintaining sinus rhythm on exam.  - Continue Cardizem SR 90mg  daily.  Hyperlipidemia Most recent lipid panel in ***: - Continue Crestor 20mg  daily and Zetia 10mg  daily.   History of PE with Acute Cor Pulmonale  History of DVT Patient was admitted  in 11/2018 with bilateral severe occlusive PE with CT evidence evidence of right heart strain. Echo at that time showed normal LV function but severely dilated and hypokinetic RV. Repeat Echo in 01/2019 showed normalization of RV. DVT/PE were felt to be due to recent travel. He completed treatment of Eliqus and is now no longer on anticoagulation.   Past Medical History:  Diagnosis Date   Allergic rhinitis    DOE (dyspnea on exertion)    Elevated blood pressure reading    Hyperlipidemia    Nephrolithiasis    Overweight    PAF (paroxysmal atrial  fibrillation) (HCC) 12/02/2018   Noted in the setting of what was likely acute on chronic PEs. ->  No recurrent symptoms since being.  No longer on DOAC.   Premature atrial contractions    Pulmonary embolus (HCC) 12/01/2018   With acute cor pulmonale: RV dilation with pressure and volume overload on echo.    Past Surgical History:  Procedure Laterality Date   24-HOUR  HOLTER MONITOR  11/2018   Sinus rhythm with PVCs burden of 5.5.  1 3 beat salvo.  Rare PACs.   LOWER EXTREMITY VENOUS DOPPLERS  12/02/2018   Acute DVT involving left peroneal vein.  Cystic structure found in right popliteal fossa.   TRANSTHORACIC ECHOCARDIOGRAM  11/2018   In setting of acute PE:  Mild LVH.  Focal basal hypertrophy.  EF 55 to 60%.  Mild MR.  Severely dilated and hypokinetic RV with severely dilated RA.  Estimated PA pressure 42 mmHg.  No PFO.  Dilated IVC and blunted respirophasic change, consistent with elevated CVP. -->  Suggests acute cor pulmonale   TRANSTHORACIC ECHOCARDIOGRAM  10/18/2022   EF 60 to 65%.  Normal LV function.  No RWMA.  Normal diastolic parameters.  Mild LA and RA dilation.  Normal aortic and mitral valves.  Normal RAP.    Current Medications: No outpatient medications have been marked as taking for the 05/10/23 encounter (Appointment) with Phillip Parker, PA-C.     Allergies:   Patient has no known allergies.   Social History   Socioeconomic History   Marital status: Married    Spouse name: Not on file   Number of children: 2   Years of education: Not on file   Highest education level: Not on file  Occupational History    Employer: [employer]    Comment: Works in Corporate treasurer  Tobacco Use   Smoking status: Never   Smokeless tobacco: Never  Substance and Sexual Activity   Alcohol use: Yes    Comment: rare   Drug use: Never   Sexual activity: Not on file  Other Topics Concern   Not on file  Social History Narrative   Lives with his wife.  Very active, usually exercises 3 days a week.   Social Determinants of Health   Financial Resource Strain: Not on file  Food Insecurity: Not on file  Transportation Needs: Not on file  Physical Activity: Not on file  Stress: Not on file  Social Connections: Not on file     Family History: The patient's family history includes Anemia in his father; Asthma in his mother; Atrial  fibrillation in an other family member; Hypertension in his mother.  ROS:   Please see the history of present illness.     EKGs/Labs/Other Studies Reviewed:    The following studies were reviewed:  Monitor 11/2018: NSR PVC's with a burden of 5.5%. One three beat salvo. Rare PAC's No diary or complaints. _______________  Echocardiogram 10/18/2022: Impressions: 1. Left ventricular ejection fraction, by estimation, is 60 to 65%. The  left ventricle has normal function. The left ventricle has no regional  wall motion abnormalities. Left ventricular diastolic parameters were  normal. The average left ventricular  global longitudinal strain is -20.0 %. The global longitudinal strain is  normal.   2. Right ventricular systolic function is normal. The right ventricular  size is normal.   3. Left atrial size was mildly dilated.   4. Right atrial size was mildly dilated.   5. The mitral valve is  normal in structure. Mild mitral valve  regurgitation. No evidence of mitral stenosis.   6. The aortic valve is tricuspid. Aortic valve regurgitation is not  visualized. Aortic valve sclerosis is present, with no evidence of aortic  valve stenosis.   7. Aortic dilatation noted. There is mild dilatation of the aortic root,  measuring 41 mm.   8. The inferior vena cava is normal in size with greater than 50%  respiratory variability, suggesting right atrial pressure of 3 mmHg.    EKG:  EKG not ordered today.  Recent Labs: 10/04/2022: Hemoglobin 16.0; Magnesium 2.2; Platelets 251; TSH 2.240 10/05/2022: B Natriuretic Peptide 31.5; BUN 20; Creatinine, Ser 1.22; Potassium 4.0; Sodium 138  Recent Lipid Panel No results found for: "CHOL", "TRIG", "HDL", "CHOLHDL", "VLDL", "LDLCALC", "LDLDIRECT"  Physical Exam:    Vital Signs: There were no vitals taken for this visit.    Wt Readings from Last 3 Encounters:  01/19/23 239 lb 9.6 oz (108.7 kg)  10/26/22 236 lb 12.8 oz (107.4 kg)  10/04/22 240  lb 6.4 oz (109 kg)     General: 72 y.o. male in no acute distress. HEENT: Normocephalic and atraumatic. Sclera clear. EOMs intact. Neck: Supple. No carotid bruits. No JVD. Heart: *** RRR. Distinct S1 and S2. No murmurs, gallops, or rubs. Radial and distal pedal pulses 2+ and equal bilaterally. Lungs: No increased work of breathing. Clear to ausculation bilaterally. No wheezes, rhonchi, or rales.  Abdomen: Soft, non-distended, and non-tender to palpation. Bowel sounds present in all 4 quadrants.  MSK: Normal strength and tone for age. *** Extremities: No lower extremity edema.    Skin: Warm and dry. Neuro: Alert and oriented x3. No focal deficits. Psych: Normal affect. Responds appropriately.   Assessment:    No diagnosis found.  Plan:     Disposition: Follow up in ***   Medication Adjustments/Labs and Tests Ordered: Current medicines are reviewed at length with the patient today.  Concerns regarding medicines are outlined above.  No orders of the defined types were placed in this encounter.  No orders of the defined types were placed in this encounter.   There are no Patient Instructions on file for this visit.   Leanne Lovely, PA-C  05/04/2023 11:27 PM    [facility]

## 2023-05-09 NOTE — Progress Notes (Signed)
Cardiology Office Note:    Date:  05/11/2023   ID:  Phillip Dalton, DOB 11/02/1951, MRN [id number]  PCP:  Roseanna Rainbow, PA-C (Inactive)  Cardiologist:  Bryan Lemma, MD  Electrophysiologist:  None   Referring MD: No ref. provider found   Chief Complaint: hypertension  History of Present Illness:    Phillip Dalton is a 72 y.o. male with a history of  coronary artery calcifications noted on chest CTA in 09/2022, PE with acute cor pulmonale in 11/2018, paroxysmal atrial fibrillation in setting of acute PE no longer on anticoagulation, DVT in 11/2018, PVCs, and hyperlipidemia who is followed by Dr. Herbie Baltimore and presents today for evaluation of hypertension.  Patient was admitted in in 11/2018 with bilateral severe occlusive PE with CT evidence of right heart strain and pulmonary infarct. Also found to have acute DVT involving the left peroneal vein. Echo showed LVEF of 55-60% with mild focal basal hypertrophy and severely dilated and hypokinetic RV as well as mild MR. He was also noted to be in new onset atrial fibrillation during admission but converted to sinus rhythm after being started on Cardizem. Of note, outpatient monitor shortly before this hospitalization showed no evidence of atrial fibrillation. Arrhythmia was felt to be due to his PE. DVT and PE were felt to be triggered by recent travel. He was started on Eliquis. Repeat Echo in 01/2019 showed normalization of RV function. He had no documented recurrence of atrial fibrillation so anticoagulation was not continued after treatment of acute PE/ DVT.   At visit in 09/2022, patient reported performance decline at the gym and some shortness of breath and he was worried that he may have another PE. D-dimer was elevated so chest CTA was performed and was negative for PE but did show mild coronary artery and aortic atherosclerotic calcifications.  Lower extremity dopplers at this time were also negative for DVT. Echo in 10/2022  showed LVEF of 60-65% with normal wall motion and diastolic parameters, normal RV, mild MR, and mildly dilated aortic root measuring 41mm.  Patient called our office on 05/03/2023 with reports of elevated BP. Therefore, this visit was arranged for further evaluation. Today, BP is well controlled at 118/74. He states he went to the [employer] to give blood about 2 weeks ago and BP was noted to be elevated with systolic BP in the 160s to 170s. He went home and checked his BP on his home BP machine and got around the same reading. Since then, he has been monitoring his BP twice day. BP has ranged from 133-170/68-90. BP is normally higher at night. Heart rates are mostly in the 70s to 80s. Patient was exercising in the gym a couple of weekends ago and states the work-out was much harder than it usually is even though he was doing similar activities. He felt like he was breathing a little higher than normal. Since then, he has not worked out at that intensity again. He has been doing calisthenic and stretching and reports doing okay with this. He denies any other shortness of breath. No chest pain. No orthopnea, PND, edema, palpitations, lightheadedness, dizziness, syncope.   Past Medical History:  Diagnosis Date   Allergic rhinitis    DOE (dyspnea on exertion)    Elevated blood pressure reading    Hyperlipidemia    Nephrolithiasis    Overweight    PAF (paroxysmal atrial fibrillation) (HCC) 12/02/2018   Noted in the setting of what was likely acute on chronic PEs. ->  No recurrent symptoms since being.  No longer on DOAC.   Premature atrial contractions    Pulmonary embolus (HCC) 12/01/2018   With acute cor pulmonale: RV dilation with pressure and volume overload on echo.    Past Surgical History:  Procedure Laterality Date   24-HOUR HOLTER MONITOR  11/2018   Sinus rhythm with PVCs burden of 5.5.  1 3 beat salvo.  Rare PACs.   LOWER EXTREMITY VENOUS DOPPLERS  12/02/2018   Acute DVT involving left  peroneal vein.  Cystic structure found in right popliteal fossa.   TRANSTHORACIC ECHOCARDIOGRAM  11/2018   In setting of acute PE:  Mild LVH.  Focal basal hypertrophy.  EF 55 to 60%.  Mild MR.  Severely dilated and hypokinetic RV with severely dilated RA.  Estimated PA pressure 42 mmHg.  No PFO.  Dilated IVC and blunted respirophasic change, consistent with elevated CVP. -->  Suggests acute cor pulmonale   TRANSTHORACIC ECHOCARDIOGRAM  10/18/2022   EF 60 to 65%.  Normal LV function.  No RWMA.  Normal diastolic parameters.  Mild LA and RA dilation.  Normal aortic and mitral valves.  Normal RAP.    Current Medications: Current Meds  Medication Sig   arginine 500 MG tablet Take 500 mg by mouth daily.   Ascorbic Acid (VITAMIN C) 1000 MG tablet Take 1,000 mg by mouth daily.   b complex vitamins tablet Take 1 tablet by mouth daily.   Chromium Picolinate 200 MCG TABS Take by mouth.   ezetimibe (ZETIA) 10 MG tablet Take 1 tablet (10 mg total) by mouth daily.   Glucosamine 500 MG CAPS Take 1 capsule by mouth daily.   Omega-3 Fatty Acids (FISH OIL) 1000 MG CAPS Take 1 capsule by mouth daily.   rosuvastatin (CRESTOR) 20 MG tablet TAKE 1 TABLET BY MOUTH EVERY DAY   selenium 200 MCG TABS tablet Take 1 tablet by mouth daily.   Turmeric (QC TUMERIC COMPLEX) 500 MG CAPS Take 1 capsule by mouth daily.   vitamin E 100 UNIT capsule Take 1 capsule by mouth daily.   [DISCONTINUED] diltiazem (CARDIZEM SR) 90 MG 12 hr capsule Take 90 mg by mouth daily.     Allergies:   Patient has no known allergies.   Social History   Socioeconomic History   Marital status: Married    Spouse name: Not on file   Number of children: 2   Years of education: Not on file   Highest education level: Not on file  Occupational History    Employer: [employer]    Comment: Works in Corporate treasurer  Tobacco Use   Smoking status: Never   Smokeless tobacco: Never  Substance and Sexual Activity   Alcohol use: Yes    Comment:  rare   Drug use: Never   Sexual activity: Not on file  Other Topics Concern   Not on file  Social History Narrative   Lives with his wife.  Very active, usually exercises 3 days a week.   Social Determinants of Health   Financial Resource Strain: Not on file  Food Insecurity: Not on file  Transportation Needs: Not on file  Physical Activity: Not on file  Stress: Not on file  Social Connections: Not on file     Family History: The patient's family history includes Anemia in his father; Asthma in his mother; Atrial fibrillation in an other family member; Hypertension in his mother.  ROS:   Please see the history of present illness.  EKGs/Labs/Other Studies Reviewed:    The following studies were reviewed today:  Monitor 11/2018: NSR PVC's with a burden of 5.5%. One three beat salvo. Rare PAC's No diary or complaints. _______________  Echocardiogram 10/18/2022: Impressions: 1. Left ventricular ejection fraction, by estimation, is 60 to 65%. The  left ventricle has normal function. The left ventricle has no regional  wall motion abnormalities. Left ventricular diastolic parameters were  normal. The average left ventricular  global longitudinal strain is -20.0 %. The global longitudinal strain is  normal.   2. Right ventricular systolic function is normal. The right ventricular  size is normal.   3. Left atrial size was mildly dilated.   4. Right atrial size was mildly dilated.   5. The mitral valve is normal in structure. Mild mitral valve  regurgitation. No evidence of mitral stenosis.   6. The aortic valve is tricuspid. Aortic valve regurgitation is not  visualized. Aortic valve sclerosis is present, with no evidence of aortic  valve stenosis.   7. Aortic dilatation noted. There is mild dilatation of the aortic root,  measuring 41 mm.   8. The inferior vena cava is normal in size with greater than 50%  respiratory variability, suggesting right atrial pressure of  3 mmHg.   EKG:  EKG not ordered today.   Recent Labs: 10/04/2022: Hemoglobin 16.0; Magnesium 2.2; Platelets 251; TSH 2.240 10/05/2022: B Natriuretic Peptide 31.5; BUN 20; Creatinine, Ser 1.22; Potassium 4.0; Sodium 138  Recent Lipid Panel No results found for: "CHOL", "TRIG", "HDL", "CHOLHDL", "VLDL", "LDLCALC", "LDLDIRECT"  Physical Exam:    Vital Signs: BP 118/74   Pulse 76   Ht 6\' 5"  (1.956 m)   Wt 234 lb 6.4 oz (106.3 kg)   SpO2 97%   BMI 27.80 kg/m     Wt Readings from Last 3 Encounters:  05/11/23 234 lb 6.4 oz (106.3 kg)  01/19/23 239 lb 9.6 oz (108.7 kg)  10/26/22 236 lb 12.8 oz (107.4 kg)     General: 72 y.o. Caucasian male in no acute distress. HEENT: Normocephalic and atraumatic. Sclera clear.  Neck: Supple. No JVD. Heart: RRR with occasional ectopy. Distinct S1 and S2. No murmurs, gallops, or rubs. Radial pulses 2+ and equal bilaterally. Lungs: No increased work of breathing. Clear to ausculation bilaterally. No wheezes, rhonchi, or rales.  Abdomen: Soft, non-distended, and non-tender to palpation. Extremities: Trace lower extremity edema bilaterally.     Skin: Warm and dry. Neuro: Alert and oriented x3. No focal deficits. Psych: Normal affect. Responds appropriately.  Assessment:    1. Primary hypertension   2. Coronary artery calcification   3. Paroxysmal atrial fibrillation (HCC)   4. Dilated aortic root (HCC)   5. Hyperlipidemia, unspecified hyperlipidemia type   6. History of pulmonary embolus (PE)   7. History of DVT (deep vein thrombosis)     Plan:    Hypertension Patient presents today due to elevated BP readings at home. BP well controlled in the office today at 118/74. However, home BP log shows BP has ranged from 133-170/68-90 at home and BP is usually higher at night. - Currently only on Cardizem SR (12 hr) 90mg  daily. Will increase to twice daily. - Advised patient to keep BP/HR log for 2 weeks and then send this to [place]  Coronary Artery  Calcifications Chest CTA in 09/2022 showed mild coronary artery and aortic atherosclerotic calcifications.  - No chest pain.  - Continue Crestor 20mg  daily and Zetia 10mg  daily.   Of note, he  does describe one episode of decreased exercise tolerance a couple of weeks ago where a usual work-out was much harder than usual. However, he has not worked out to that intensity since. Advised patient to let us know if this persists. If so, will likely recommend a coronary CTA.  Paroxysmal Atrial Fibrillation Occurred in the setting of acute PE with RV strain. He has had no documented recurrence since then so is no longer on anticoagulation.  - Maintaining sinus rhythm on exam. Occasional ectopy noted consistent with his history of PVCs. - Will increase Cardizem SR to 90mg  twice daily for better BP control as above.  Mildly Dilated Aortic Root Recent Echo in 10/2022 showed mild dilatation of the aortic root measuring 41 mm. - Consider repeat Echo in 10/2023.  Hyperlipidemia Most recent lipid panel in 03/2022: Total Cholesterol 197, Triglycerides 67, HDL 74, LDL 111. LDL goal <70. - Continue Crestor 20mg  daily and Zetia 10mg  daily.  - Labs followed by PCP. If LDL still above goal at that time, would recommend increasing Crestor to 40mg  daily.  History of PE with Acute Cor Pulmonale  History of DVT Patient was admitted  in 11/2018 with bilateral severe occlusive PE with CT evidence evidence of right heart strain. Echo at that time showed normal LV function but severely dilated and hypokinetic RV. Repeat Echo in 01/2019 showed normalization of RV. DVT/PE were felt to be due to recent travel. He completed treatment of Eliqus and is now no longer on anticoagulation.   Disposition: Follow up in 6 months.   Medication Adjustments/Labs and Tests Ordered: Current medicines are reviewed at length with the patient today.  Concerns regarding medicines are outlined above.  No orders of the defined types were  placed in this encounter.  Meds ordered this encounter  Medications   diltiazem (CARDIZEM SR) 90 MG 12 hr capsule    Sig: Take 1 capsule (90 mg total) by mouth 2 (two) times daily.    Dispense:  60 capsule    Refill:  3    Patient Instructions  Medication Instructions:  INCREASE DILTIAZEM 90MG  TWICE DAILY *If you need a refill on your cardiac medications before your next appointment, please call your pharmacy*  Lab Work: NONE If you have labs (blood work) drawn today and your tests are completely normal, you will receive your results only by: MyChart Message (if you have MyChart) OR A paper copy in the mail If you have any lab test that is abnormal or we need to change your treatment, we will call you to review the results.  Testing/Procedures: NONE  Follow-Up: At [facility], you and your health needs are our priority.  As part of our continuing mission to provide you with exceptional heart care, we have created designated Provider Care Teams.  These Care Teams include your primary Cardiologist (physician) and Advanced Practice Providers (APPs -  Physician Assistants and Nurse Practitioners) who all work together to provide you with the care you need, when you need it.  Your next appointment:   6 month(s)  Provider:   Bryan Lemma, MD     Other Instructions TAKE AN LOG YOUR BLOOD PRESSURE AND HEART RATE FOR 2 WEEKS     Signed, Corrin Parker, PA-C  05/11/2023 2:34 PM    [facility]

## 2023-05-10 ENCOUNTER — Ambulatory Visit: Payer: BC Managed Care – PPO | Admitting: Student

## 2023-05-11 ENCOUNTER — Ambulatory Visit: Payer: BC Managed Care – PPO | Attending: Student | Admitting: Student

## 2023-05-11 ENCOUNTER — Encounter: Payer: Self-pay | Admitting: Student

## 2023-05-11 VITALS — BP 118/74 | HR 76 | Ht 77.0 in | Wt 234.4 lb

## 2023-05-11 DIAGNOSIS — I77810 Thoracic aortic ectasia: Secondary | ICD-10-CM | POA: Diagnosis not present

## 2023-05-11 DIAGNOSIS — I2510 Atherosclerotic heart disease of native coronary artery without angina pectoris: Secondary | ICD-10-CM | POA: Diagnosis not present

## 2023-05-11 DIAGNOSIS — I10 Essential (primary) hypertension: Secondary | ICD-10-CM

## 2023-05-11 DIAGNOSIS — Z86718 Personal history of other venous thrombosis and embolism: Secondary | ICD-10-CM

## 2023-05-11 DIAGNOSIS — I480 Paroxysmal atrial fibrillation: Secondary | ICD-10-CM | POA: Diagnosis not present

## 2023-05-11 DIAGNOSIS — Z86711 Personal history of pulmonary embolism: Secondary | ICD-10-CM

## 2023-05-11 DIAGNOSIS — I2584 Coronary atherosclerosis due to calcified coronary lesion: Secondary | ICD-10-CM

## 2023-05-11 DIAGNOSIS — E785 Hyperlipidemia, unspecified: Secondary | ICD-10-CM

## 2023-05-11 MED ORDER — DILTIAZEM HCL ER 90 MG PO CP12: 90.0000 mg | ORAL_CAPSULE | Freq: Two times a day (BID) | ORAL | 3 refills | Status: DC

## 2023-05-11 NOTE — Patient Instructions (Signed)
Medication Instructions:  INCREASE DILTIAZEM 90MG  TWICE DAILY *If you need a refill on your cardiac medications before your next appointment, please call your pharmacy*  Lab Work: NONE If you have labs (blood work) drawn today and your tests are completely normal, you will receive your results only by: MyChart Message (if you have MyChart) OR A paper copy in the mail If you have any lab test that is abnormal or we need to change your treatment, we will call you to review the results.  Testing/Procedures: NONE  Follow-Up: At [facility], you and your health needs are our priority.  As part of our continuing mission to provide you with exceptional heart care, we have created designated Provider Care Teams.  These Care Teams include your primary Cardiologist (physician) and Advanced Practice Providers (APPs -  Physician Assistants and Nurse Practitioners) who all work together to provide you with the care you need, when you need it.  Your next appointment:   6 month(s)  Provider:   Bryan Lemma, MD     Other Instructions TAKE AN LOG YOUR BLOOD PRESSURE AND HEART RATE FOR 2 WEEKS

## 2023-06-05 NOTE — Telephone Encounter (Signed)
BP is still mildly above goal. Let's stop his current dose of Cardizem and start Cardizem CD (the 24 hour tablet) 240mg  ONCE DAILY instead. He should continue to monitor his BP/HR with this. If heart rates consistently <60 bpm with this higher dose of if he is having any lightheadedness/ dizziness, he should let us know. BP goal is <130/80. If BP still uncontrolled on this higher dose, we may need to add another medication.   Thank you!

## 2023-06-18 ENCOUNTER — Telehealth: Payer: Self-pay | Admitting: Student

## 2023-06-18 NOTE — Telephone Encounter (Signed)
Patient is calling about his Mychart message.  He  wants to know if there is anything else he needs to be doing.

## 2023-06-18 NOTE — Telephone Encounter (Signed)
Spoke with patient of [place] PA. He has been only diltiazem 90mg  BID. Patient was not provided message from [facility] on 6/25 as noted below   Corrin Parker, PA-C   06/05/23  9:12 AM Note BP is still mildly above goal. Let's stop his current dose of Cardizem and start Cardizem CD (the 24 hour tablet) 240mg  ONCE DAILY instead. He should continue to monitor his BP/HR with this. If heart rates consistently <60 bpm with this higher dose of if he is having any lightheadedness/ dizziness, he should let us know. BP goal is <130/80. If BP still uncontrolled on this higher dose, we may need to add another medication.    Thank you!      He reports his home BP monitor always reads higher than when in a doctor's office setting. He is not sure how old his BP cuff is. He has an arm cuff. Provided him with name of [employer] and [employer]. He will check into new machine  He would like Callie PA to review this message as he never was notified to start increased dose.

## 2023-06-19 NOTE — Telephone Encounter (Signed)
His BP was well controlled in the office. If he wants to wait to see what his BP runs on a new home BP machine before making the below changes that is fine. I would recommend he send in a repeat log of his BP/HR once he gets a new machine. The alternative would be arranging a nurse visit for him to bring his current BP machine in and see if it is accurate.  Thank you!

## 2023-06-19 NOTE — Telephone Encounter (Signed)
Gave recommendations per the PA-C.  He states he will get the new BP machine and keep a new log.  He may set appointment to come in with the new machine to make sure it is accurate, but will wait for now.

## 2023-06-19 NOTE — Telephone Encounter (Signed)
LVM to call office.

## 2023-06-22 ENCOUNTER — Telehealth: Payer: Self-pay | Admitting: Student

## 2023-06-22 MED ORDER — DILTIAZEM HCL ER 90 MG PO CP12: 90.0000 mg | ORAL_CAPSULE | Freq: Two times a day (BID) | ORAL | 3 refills | Status: DC

## 2023-06-22 NOTE — Telephone Encounter (Signed)
*  STAT* If patient is at the pharmacy, call can be transferred to refill team.   1. Which medications need to be refilled? (please list name of each medication and dose if known)   diltiazem (CARDIZEM SR) 90 MG 12 hr capsule    2. Which pharmacy/location (including street and city if local pharmacy) is medication to be sent to? [id number] - [place], [place] - [address]   3. Do they need a 30 day or 90 day supply? 30 day  Patient is completely out of medication.

## 2023-07-18 NOTE — Telephone Encounter (Signed)
BP above goal (130/80). Recommend we stop current dose of Cardizem and start Cardizem CD (the 24 hour tablet) 240mg  ONCE DAILY instead. He should continue to monitor his BP/HR with this. If heart rates consistently <55 bpm with this higher dose of if he is having any lightheadedness/ dizziness, he should let us know. And if his BP is consistently >130/80, he should let us know. If BP still uncontrolled on this higher dose, we may need to add another medication.   Thank you! Jeena Arnett

## 2023-07-19 MED ORDER — DILTIAZEM HCL ER COATED BEADS 240 MG PO CP24: 240.0000 mg | ORAL_CAPSULE | Freq: Every day | ORAL | 3 refills | Status: DC

## 2023-07-19 NOTE — Telephone Encounter (Signed)
Called and notified pt to stop Cardizem and start Cardizem CD (the 24 hour tablet) 240mg  ONCE DAILY instead. He will call as directed in this message ( If heart rates consistently <55 bpm with this higher dose of if he is having any lightheadedness/ dizziness, he should let us know. And if his BP is consistently >130/80, he should let us know) Verbalized understanding, sent to [id number] - [place], [place] - [address]. As directed by pt.  He will call as directed above

## 2023-07-20 ENCOUNTER — Other Ambulatory Visit: Payer: Self-pay | Admitting: Student

## 2023-08-02 DIAGNOSIS — Z131 Encounter for screening for diabetes mellitus: Secondary | ICD-10-CM | POA: Diagnosis not present

## 2023-08-02 DIAGNOSIS — Z23 Encounter for immunization: Secondary | ICD-10-CM | POA: Diagnosis not present

## 2023-08-02 DIAGNOSIS — Z125 Encounter for screening for malignant neoplasm of prostate: Secondary | ICD-10-CM | POA: Diagnosis not present

## 2023-08-02 DIAGNOSIS — Z0000 Encounter for general adult medical examination without abnormal findings: Secondary | ICD-10-CM | POA: Diagnosis not present

## 2023-08-02 DIAGNOSIS — E785 Hyperlipidemia, unspecified: Secondary | ICD-10-CM | POA: Diagnosis not present

## 2023-08-18 ENCOUNTER — Other Ambulatory Visit: Payer: Self-pay | Admitting: Nurse Practitioner

## 2023-08-18 DIAGNOSIS — E785 Hyperlipidemia, unspecified: Secondary | ICD-10-CM

## 2023-09-10 DIAGNOSIS — D2261 Melanocytic nevi of right upper limb, including shoulder: Secondary | ICD-10-CM | POA: Diagnosis not present

## 2023-09-10 DIAGNOSIS — D225 Melanocytic nevi of trunk: Secondary | ICD-10-CM | POA: Diagnosis not present

## 2023-09-10 DIAGNOSIS — Z08 Encounter for follow-up examination after completed treatment for malignant neoplasm: Secondary | ICD-10-CM | POA: Diagnosis not present

## 2023-09-10 DIAGNOSIS — L814 Other melanin hyperpigmentation: Secondary | ICD-10-CM | POA: Diagnosis not present

## 2023-09-10 DIAGNOSIS — L821 Other seborrheic keratosis: Secondary | ICD-10-CM | POA: Diagnosis not present

## 2023-09-10 DIAGNOSIS — D485 Neoplasm of uncertain behavior of skin: Secondary | ICD-10-CM | POA: Diagnosis not present

## 2023-11-15 DIAGNOSIS — D485 Neoplasm of uncertain behavior of skin: Secondary | ICD-10-CM | POA: Diagnosis not present

## 2023-11-15 DIAGNOSIS — D2261 Melanocytic nevi of right upper limb, including shoulder: Secondary | ICD-10-CM | POA: Diagnosis not present

## 2023-11-15 DIAGNOSIS — S51801A Unspecified open wound of right forearm, initial encounter: Secondary | ICD-10-CM | POA: Diagnosis not present

## 2023-11-15 DIAGNOSIS — D0439 Carcinoma in situ of skin of other parts of face: Secondary | ICD-10-CM | POA: Diagnosis not present

## 2023-12-10 ENCOUNTER — Ambulatory Visit: Payer: BC Managed Care – PPO | Attending: Cardiology | Admitting: Cardiology

## 2023-12-10 VITALS — BP 126/70 | HR 65 | Ht 77.0 in | Wt 235.0 lb

## 2023-12-10 DIAGNOSIS — I480 Paroxysmal atrial fibrillation: Secondary | ICD-10-CM

## 2023-12-10 DIAGNOSIS — I2510 Atherosclerotic heart disease of native coronary artery without angina pectoris: Secondary | ICD-10-CM | POA: Diagnosis not present

## 2023-12-10 DIAGNOSIS — I2609 Other pulmonary embolism with acute cor pulmonale: Secondary | ICD-10-CM

## 2023-12-10 DIAGNOSIS — I493 Ventricular premature depolarization: Secondary | ICD-10-CM | POA: Diagnosis not present

## 2023-12-10 DIAGNOSIS — E785 Hyperlipidemia, unspecified: Secondary | ICD-10-CM

## 2023-12-10 DIAGNOSIS — I491 Atrial premature depolarization: Secondary | ICD-10-CM

## 2023-12-10 DIAGNOSIS — R030 Elevated blood-pressure reading, without diagnosis of hypertension: Secondary | ICD-10-CM

## 2023-12-10 NOTE — Patient Instructions (Signed)
Medication Instructions:  No changes *If you need a refill on your cardiac medications before your next appointment, please call your pharmacy*   Lab Work: Not needed   Testing/Procedures:  Not needed Follow-Up: At [facility], you and your health needs are our priority.  As part of our continuing mission to provide you with exceptional heart care, we have created designated Provider Care Teams.  These Care Teams include your primary Cardiologist (physician) and Advanced Practice Providers (APPs -  Physician Assistants and Nurse Practitioners) who all work together to provide you with the care you need, when you need it.     Your next appointment:   12 month(s)  The format for your next appointment:   In Person  Provider:   David Harding, MD    

## 2023-12-10 NOTE — Progress Notes (Unsigned)
Cardiology Office Note:  .   Date:  12/10/2023  ID:  Phillip Dalton, DOB 1951-06-22, MRN [id number] PCP: Shirlyn Goltz (Inactive)  [facility] Providers Cardiologist:  Bryan Lemma, MD { Click to update primary MD,subspecialty MD or APP then REFRESH:1}    No chief complaint on file.   Patient Profile: .     Phillip Dalton is a 72 y.o. male with a PMH notable for PAF in the setting of PE with cor pulmonale in December 2019, frequent PVCs and HLD who presents here for 6 to 33-month follow-up at the request of No ref. provider found.(Wright, Stephanie Acre, PA-C (Inactive))  No longer on anticoagulation having been treated for PE.     I last saw Phillip Dalton on January 19, 2023-he was doing well.  Pretty much back to his full level of exercise.  Notably improved exertional dyspnea.  No sensation of rapid irregular heartbeats or palpitations. He was then seen by Marjie Skiff, PA on May 11, 2023 for evaluation of hypertension-he contacted the office because of blood pressure recordings at the [facility] when he went to give blood and he noted pressures in the 160s and 170s.  Similar readings noted at home still exercising in the gym and having no symptoms. => Recommended 2-week BP log and continued Cardizem but increased to 90 mg twice daily.  Continued on 20 mg Crestor +10 mg Zetia.  Goal is LDL less than 70 due to coronary calcification.  Suggested possibly increasing Crestor to 40 mg if not at goal.  Subjective  Discussed the use of AI scribe software for clinical note transcription with the patient, who gave verbal consent to proceed.  History of Present Illness          Cardiovascular ROS: {roscv:310661}  ROS:  Review of Systems - {ros master:310782}    Objective   Studies Reviewed: Marland Kitchen   EKG Interpretation Date/Time:  Monday December 10 2023 14:16:04 EST Ventricular Rate:  66 PR Interval:  170 QRS Duration:  96 QT Interval:  450 QTC  Calculation: 471 R Axis:   41  Text Interpretation: Sinus rhythm with occasional Premature ventricular complexes When compared with ECG of 05-Oct-2022 13:07, PREVIOUS ECG IS PRESENT Confirmed by Bryan Lemma ([id number]) on 12/10/2023 2:42:14 PM   TTE 10/18/2022: EF 60 to 65%.  Normal LV function.  No RWMA.  Normal diastolic parameters.  Mild LA and RA dilation.  Normal aortic and mitral valves.  Normal RAP   Risk Assessment/Calculations:    CHA2DS2-VASc Score = 3  {Confirm score is correct.  If not, click here to update score.  REFRESH note.  :1} This indicates a 3.2% annual risk of stroke. The patient's score is based upon: CHF History: 0 HTN History: 1 Diabetes History: 0 Stroke History: 0 Vascular Disease History: 1 Age Score: 1 Gender Score: 0   Patient has not had any recurrent episodes of A-fib since PE.  Have decided to hold off on long-term anticoagulation until evidence of recurrence.          Physical Exam:   VS:  BP 126/70 (BP Location: Left Arm, Patient Position: Sitting, Cuff Size: Normal)   Pulse 65   Ht 6\' 5"  (1.956 m)   Wt 235 lb (106.6 kg)   SpO2 95%   BMI 27.87 kg/m    Wt Readings from Last 3 Encounters:  12/10/23 235 lb (106.6 kg)  05/11/23 234 lb 6.4 oz (106.3 kg)  01/19/23 239 lb 9.6 oz (108.7  kg)    GEN: Well nourished, well developed in no acute distress; healthy-appearing.  Well-groomed. NECK: No JVD; No carotid bruits CARDIAC: Normal S1, S2; RRR, no murmurs, rubs, gallops RESPIRATORY:  Clear to auscultation without rales, wheezing or rhonchi ; nonlabored, good air movement. ABDOMEN: Soft, non-tender, non-distended EXTREMITIES:  No edema; No deformity     ASSESSMENT AND PLAN: .    Problem List Items Addressed This Visit       Cardiology Problems   Coronary artery calcification (Chronic)   Relevant Orders   EKG 12-Lead (Completed)   Hyperlipidemia with target LDL less than 100 (Chronic)   Relevant Orders   EKG 12-Lead (Completed)   PAF  (paroxysmal atrial fibrillation) (HCC) -not on anticoagulation due to CHA2DS2-VASc score of 1 (09/2022) (Chronic)   Relevant Orders   EKG 12-Lead (Completed)   Pulmonary embolism with acute cor pulmonale (HCC) - Primary (Chronic)   Relevant Orders   EKG 12-Lead (Completed)   PVC's (premature ventricular contractions) (Chronic)   Relevant Orders   EKG 12-Lead (Completed)     Other   Elevated blood pressure reading   Relevant Orders   EKG 12-Lead (Completed)    Assessment and Plan                 Follow-Up: No follow-ups on file.  Total time spent: *** min spent with patient + *** min spent charting = *** min    Signed, Marykay Lex, MD, MS Bryan Lemma, M.D., M.S. Interventional Cardiologist  [facility]  Pager # [phone] Phone # [phone] [address] [place], [place] [address]

## 2023-12-15 ENCOUNTER — Encounter: Payer: Self-pay | Admitting: Cardiology

## 2023-12-15 NOTE — Assessment & Plan Note (Signed)
 Fairly well-controlled.  No major issues.  Continues on diltiazem.

## 2023-12-15 NOTE — Assessment & Plan Note (Signed)
 Well controlled on Diltiazem 240mg  daily. No symptoms of fatigue or difficulty with exercise. -Continue Diltiazem 240mg  daily.

## 2023-12-15 NOTE — Assessment & Plan Note (Signed)
 No current symptoms. PE was provoked by travel, but was somewhat complicated by cor pulmonale and A-fib.  Stabilized after initial treatment.  Completed DOAC course. -No current treatment needed.

## 2023-12-15 NOTE — Assessment & Plan Note (Addendum)
 Coronary calcification noted on CT scan but no active symptoms.  Targeting LDL less than 100 as well as stressed importance of blood pressure control and risk factor modification.   If he were to have concerning symptoms, we can consider further testing, but at this point he is already on statin and Zetia  with well-controlled lipids, and BP is controlled so we would not necessarily change medications.

## 2023-12-15 NOTE — Assessment & Plan Note (Signed)
 Well controlled on Zetia and Crestor three times a week. Last cholesterol check in August showed total cholesterol of 164, triglycerides 68, HDL 69, and LDL 82. -Continue Zetia and Crestor three times a week (Monday, Wednesday, Friday).

## 2023-12-15 NOTE — Assessment & Plan Note (Addendum)
 1 episode of A-fib in the setting of PE. No current symptoms of irregular heartbeats, fatigue, or increased heart rate. History of PE, but no current symptoms. Mild left and right atrial dilation noted on last echo a year ago. -Continue current management.  If symptoms of AFib occur, consider EKG and potential long-term anticoagulation. Remains on diltiazem  240 mg for blood pressure control as well as potential rate control if he has recurrence of A-fib.  (Also treating PACs)

## 2023-12-21 DIAGNOSIS — D0439 Carcinoma in situ of skin of other parts of face: Secondary | ICD-10-CM | POA: Diagnosis not present

## 2024-02-10 ENCOUNTER — Other Ambulatory Visit: Payer: Self-pay | Admitting: Cardiology

## 2024-04-17 DIAGNOSIS — Z08 Encounter for follow-up examination after completed treatment for malignant neoplasm: Secondary | ICD-10-CM | POA: Diagnosis not present

## 2024-04-17 DIAGNOSIS — L821 Other seborrheic keratosis: Secondary | ICD-10-CM | POA: Diagnosis not present

## 2024-04-17 DIAGNOSIS — L814 Other melanin hyperpigmentation: Secondary | ICD-10-CM | POA: Diagnosis not present

## 2024-04-17 DIAGNOSIS — D225 Melanocytic nevi of trunk: Secondary | ICD-10-CM | POA: Diagnosis not present

## 2024-08-14 DIAGNOSIS — I10 Essential (primary) hypertension: Secondary | ICD-10-CM | POA: Diagnosis not present

## 2024-08-14 DIAGNOSIS — Z125 Encounter for screening for malignant neoplasm of prostate: Secondary | ICD-10-CM | POA: Diagnosis not present

## 2024-08-14 DIAGNOSIS — Z0000 Encounter for general adult medical examination without abnormal findings: Secondary | ICD-10-CM | POA: Diagnosis not present

## 2024-08-14 DIAGNOSIS — Z1331 Encounter for screening for depression: Secondary | ICD-10-CM | POA: Diagnosis not present

## 2024-08-14 DIAGNOSIS — E785 Hyperlipidemia, unspecified: Secondary | ICD-10-CM | POA: Diagnosis not present

## 2024-09-11 DIAGNOSIS — L814 Other melanin hyperpigmentation: Secondary | ICD-10-CM | POA: Diagnosis not present

## 2024-09-11 DIAGNOSIS — L820 Inflamed seborrheic keratosis: Secondary | ICD-10-CM | POA: Diagnosis not present

## 2024-09-11 DIAGNOSIS — R208 Other disturbances of skin sensation: Secondary | ICD-10-CM | POA: Diagnosis not present

## 2024-09-11 DIAGNOSIS — L538 Other specified erythematous conditions: Secondary | ICD-10-CM | POA: Diagnosis not present

## 2024-09-11 DIAGNOSIS — Z872 Personal history of diseases of the skin and subcutaneous tissue: Secondary | ICD-10-CM | POA: Diagnosis not present

## 2024-09-11 DIAGNOSIS — L821 Other seborrheic keratosis: Secondary | ICD-10-CM | POA: Diagnosis not present

## 2024-09-11 DIAGNOSIS — L2989 Other pruritus: Secondary | ICD-10-CM | POA: Diagnosis not present

## 2024-09-11 DIAGNOSIS — D1801 Hemangioma of skin and subcutaneous tissue: Secondary | ICD-10-CM | POA: Diagnosis not present

## 2024-09-17 ENCOUNTER — Other Ambulatory Visit: Payer: Self-pay | Admitting: Cardiology

## 2024-09-17 DIAGNOSIS — E785 Hyperlipidemia, unspecified: Secondary | ICD-10-CM

## 2024-09-30 ENCOUNTER — Other Ambulatory Visit: Payer: Self-pay | Admitting: Cardiology

## 2024-12-16 ENCOUNTER — Encounter: Payer: Self-pay | Admitting: Cardiology

## 2024-12-16 ENCOUNTER — Ambulatory Visit: Attending: Cardiology | Admitting: Cardiology

## 2024-12-16 VITALS — BP 136/78 | HR 63 | Ht 77.0 in | Wt 238.5 lb

## 2024-12-16 DIAGNOSIS — I491 Atrial premature depolarization: Secondary | ICD-10-CM | POA: Insufficient documentation

## 2024-12-16 DIAGNOSIS — I2609 Other pulmonary embolism with acute cor pulmonale: Secondary | ICD-10-CM | POA: Diagnosis not present

## 2024-12-16 DIAGNOSIS — I2510 Atherosclerotic heart disease of native coronary artery without angina pectoris: Secondary | ICD-10-CM | POA: Insufficient documentation

## 2024-12-16 DIAGNOSIS — R030 Elevated blood-pressure reading, without diagnosis of hypertension: Secondary | ICD-10-CM | POA: Diagnosis present

## 2024-12-16 DIAGNOSIS — E785 Hyperlipidemia, unspecified: Secondary | ICD-10-CM | POA: Diagnosis present

## 2024-12-16 DIAGNOSIS — I480 Paroxysmal atrial fibrillation: Secondary | ICD-10-CM | POA: Diagnosis present

## 2024-12-16 DIAGNOSIS — I493 Ventricular premature depolarization: Secondary | ICD-10-CM | POA: Insufficient documentation

## 2024-12-16 MED ORDER — ROSUVASTATIN CALCIUM 20 MG PO TABS: 20.0000 mg | ORAL_TABLET | ORAL | 3 refills | Status: AC

## 2024-12-16 MED ORDER — EZETIMIBE 10 MG PO TABS: 10.0000 mg | ORAL_TABLET | ORAL | 3 refills | Status: AC

## 2024-12-16 MED ORDER — DILTIAZEM HCL ER COATED BEADS 240 MG PO CP24: 240.0000 mg | ORAL_CAPSULE | Freq: Every day | ORAL | 3 refills | Status: AC

## 2024-12-16 NOTE — Assessment & Plan Note (Signed)
 Controlled.  No major issues.  Continue diltiazem .

## 2024-12-16 NOTE — Assessment & Plan Note (Addendum)
 Being treated with diltiazem  240 mL daily to avoid fatigue with beta-blocker.  Using calcium  channel blocker as well as possible rate control for recurrence of A-fib along with PACs and PVCs.  Blood pressure readings at home are generally in the 120s-130s, but elevated in the office. Current management includes diltiazem . No additional antihypertensive therapy initiated due to absence of symptoms. - Continue diltiazem  for blood pressure management. - Monitor blood pressure regularly. - Will consider additional antihypertensive therapy if symptoms develop.

## 2024-12-16 NOTE — Assessment & Plan Note (Signed)
 Currently on Crestor  3 days a week plus Zetia  10 mg. = Will need to review PCP labs

## 2024-12-16 NOTE — Assessment & Plan Note (Addendum)
 No active anginal symptoms.  Will continue to treat risk factors Target LDL less than 100 and maintain blood pressure control. Can discuss Coronary Calcium  Scoring for risk ratification to determine benefit of using aspirin. - Continue rosuvastatin  and ezetimibe  for lipid management. - Monitor for symptoms of coronary artery disease. - Will consider coronary calcium  score if further risk stratification is needed.

## 2024-12-16 NOTE — Assessment & Plan Note (Signed)
 Asymptomatic. Continue diltiazem.

## 2024-12-16 NOTE — Assessment & Plan Note (Signed)
 Resolved symptoms of PE.  Completed course of DOAC.  Evidence of cor pulmonale notably improved on echocardiogram. Will hold off on continued DOAC.

## 2024-12-16 NOTE — Progress Notes (Signed)
 " Cardiology Office Note:  .   Date:  12/21/2024  ID:  Phillip Dalton, DOB [birth year], MRN [id number] PCP: Brien Josette CHRISTELLA DEVONNA (Inactive)  [facility] Providers Cardiologist:  Alm Clay, MD     Chief Complaint  Patient presents with   Follow-up    Annual follow-up    Patient Profile: .     Phillip Dalton is a  74 y.o. male with a PMH notable for PAF in setting of PE with cor pulmonale in December 2019, frequent PVCs, HTN and HLD with coronary calcification noted on chest CT who presents here for annual follow-up at the request of No ref. provider found.(Phillip Dalton, Josette CHRISTELLA, PA-C (Inactive))  No longer on anticoagulation having been treated for PE.     Phillip Dalton was last seen on December 10, 2023: He was doing well with no major issues.  No recurrence of A-fib with no palpitations or irregular heartbeats.  No syncope/near syncope.  No chest tightness pressure pain.  No recurrent PE and he was no longer on DOAC having completed his course for PE.  Based on his episode of A-fib being triggered by PE, we felt that we will continue diltiazem  for BP and potential rate control but would hold off on further anticoagulation after completing his treatment for PE.  Plan was to target LDL less than 100 with combination of Crestor  and Zetia  (taking Crestor  3 times a week), continue to treat blood pressure with diltiazem  for both rate control and PAC treatment along with BP.  Chose to use diltiazem  in favor of beta-blocker because of issues with fatigue and exercise intolerance on beta-blocker.  Subjective  Discussed the use of AI scribe software for clinical note transcription with the patient, who gave verbal consent to proceed.  History of Present Illness Phillip Dalton is a 74 year old male with atrial fibrillation and a history of pulmonary embolism who presents for a routine follow-up.  No issues with heart racing, skipping, or irregular heartbeats. No  unilateral swelling, shortness of breath, chest pain, tightness, or pressure. No recurrence of symptoms related to his previous pulmonary embolism since December 2019.  He exercises regularly, performing a full workout on Saturdays and body weight exercises on Thursdays. He experiences soreness after workouts but denies any shortness of breath or chest pain during exercise.  His blood pressure readings at home vary, with electronic devices showing readings in the 140s and manual devices showing readings in the 120s. His most recent lab work in September showed stable cholesterol levels and normal kidney function.  He is currently taking rosuvastatin  20 mg three times a week, ezetimibe  10 mg daily, and diltiazem  240 mg. He is not taking aspirin, Motrin, or Aleve.  Cardiovascular ROS: no chest pain or dyspnea on exertion negative for - edema, irregular heartbeat, loss of consciousness, orthopnea, palpitations, paroxysmal nocturnal dyspnea, rapid heart rate, shortness of breath, or lightheadedness, dizziness or wooziness, syncope or near syncope, TIA or amaurosis fugax, claudication    Objective   Medications: Diltiazem  240 mg daily; Zetia  10 mg daily; rosuvastatin  20 mg daily. Not Rx: Arginine 500 mg daily, B complex vitamin, chromium picolinate, fish oil 1000 mg, glucosamine 500 mg, turmeric 5 mg daily, selenium 200 mcg, vitamin C 1000 mg, vitamin EE 45 mg  Studies Reviewed: SABRA   EKG Interpretation Date/Time:  Tuesday December 16 2024 08:06:32 EST Ventricular Rate:  63 PR Interval:  162 QRS Duration:  92 QT Interval:  434 QTC Calculation: 444 R Axis:  45  Text Interpretation: Normal sinus rhythm Normal ECG When compared with ECG of 10-Dec-2023 14:16, Premature ventricular complexes are no longer Present Confirmed by Anner Lenis ([id number]) on 12/16/2024 8:33:14 AM    Results Labs Total cholesterol (08/14/2024): 174 HDL (08/14/2024): 78 LDL (90/95/7974): 82 Triglycerides (08/14/2024):  71 Creatinine (08/14/2024): 1.22 Potassium (08/14/2024): 4.5 TSH (08/14/2024): 2.69  Radiology CT chest (coronary calcium ): Aortic atherosclerosis with coronary artery calcification.  Diagnostic No new cardiac studies: Prior Cardiac Studies Echocardiogram 10/18/2022: EF 60 to 65%.  Normal LV function.  No RWMA.  Normal diastolic parameters.  Mild left atrial dilation, mild right atrial dilation, mild aortic root dilation; Normal aortic and mitral valves.  Normal RAP   Risk Assessment/Calculations:    CHA2DS2-VASc Score = 3   This indicates a 3.2% annual risk of stroke. The patient's score is based upon: CHF History: 0 HTN History: 1 Diabetes History: 0 Stroke History: 0 Vascular Disease History: 1 Age Score: 1 Gender Score: 0 1 episode of A-fib only with no recurrence.  This was a setting of PE.  Therefore we have not continued DOAC       Physical Exam:   VS:  BP 136/78   Pulse 63   Ht 6' 5 (1.956 m)   Wt 238 lb 8 oz (108.2 kg)   SpO2 93%   BMI 28.28 kg/m    Wt Readings from Last 3 Encounters:  12/16/24 238 lb 8 oz (108.2 kg)  12/10/23 235 lb (106.6 kg)  05/11/23 234 lb 6.4 oz (106.3 kg)    GEN: Well nourished, well developed in no acute distress; healthy appearing NECK: No JVD; No carotid bruits CARDIAC: Normal S1, S2; RRR, no murmurs, rubs, gallops RESPIRATORY:  Clear to auscultation without rales, wheezing or rhonchi ; nonlabored, good air movement. ABDOMEN: Soft, non-tender, non-distended EXTREMITIES:  No edema; No deformity     ASSESSMENT AND PLAN: .    Other pulmonary embolism with acute cor pulmonale, unspecified chronicity (HCC) Assessment & Plan: Resolved symptoms of PE.  Completed course of DOAC.  Evidence of cor pulmonale notably improved on echocardiogram. Will hold off on continued DOAC.   PAF (paroxysmal atrial fibrillation) (HCC) -not on anticoagulation due to CHA2DS2-VASc score of 1 (09/2022) Assessment & Plan: With only 1 episode of A-fib  in the setting of PE and no recurrent symptoms, will hold off on long-term DOAC. Continue diltiazem  for rate control/BP in the setting of PACs and PVCs, but will hold off on DOAC unless he has recurrence of A-fib.   PVC's (premature ventricular contractions) Assessment & Plan: Asymptomatic.  Continue diltiazem .  Orders: -     EKG 12-Lead  Coronary artery calcification Assessment & Plan: No active anginal symptoms.  Will continue to treat risk factors Target LDL less than 100 and maintain blood pressure control. Can discuss Coronary Calcium  Scoring for risk ratification to determine benefit of using aspirin. - Continue rosuvastatin  and ezetimibe  for lipid management. - Monitor for symptoms of coronary artery disease. - Will consider coronary calcium  score if further risk stratification is needed.  Orders: -     EKG 12-Lead  Premature atrial contractions Assessment & Plan: Controlled.  No major issues.  Continue diltiazem .  Orders: -     EKG 12-Lead  Elevated blood pressure reading Assessment & Plan: Being treated with diltiazem  240 mL daily to avoid fatigue with beta-blocker.  Using calcium  channel blocker as well as possible rate control for recurrence of A-fib along with PACs and PVCs.  Blood pressure  readings at home are generally in the 120s-130s, but elevated in the office. Current management includes diltiazem . No additional antihypertensive therapy initiated due to absence of symptoms. - Continue diltiazem  for blood pressure management. - Monitor blood pressure regularly. - Will consider additional antihypertensive therapy if symptoms develop.   Hyperlipidemia with target LDL less than 100 Assessment & Plan: Currently on Crestor  3 days a week plus Zetia  10 mg. = Will need to review PCP labs  Orders: -     Ezetimibe ; Take 1 tablet (10 mg total) by mouth every Monday, Wednesday, and Friday.  Dispense: 45 tablet; Refill: 3  Other orders -     dilTIAZem  HCl ER Coated  Beads; Take 1 capsule (240 mg total) by mouth daily.  Dispense: 90 capsule; Refill: 3 -     Rosuvastatin  Calcium ; Take 1 tablet (20 mg total) by mouth every Monday, Wednesday, and Friday.  Dispense: 45 tablet; Refill: 3         Follow-Up: Return in about 1 year (around 12/16/2025) for [employer].     Signed, Alm MICAEL Clay, MD, MS Alm Clay, M.D., M.S. Interventional Cardiologist  [facility] Pager # [phone]      "

## 2024-12-16 NOTE — Assessment & Plan Note (Signed)
 With only 1 episode of A-fib in the setting of PE and no recurrent symptoms, will hold off on long-term DOAC. Continue diltiazem  for rate control/BP in the setting of PACs and PVCs, but will hold off on DOAC unless he has recurrence of A-fib.

## 2024-12-16 NOTE — Patient Instructions (Addendum)
 Medication Instructions:   No changes    Medication refilled *If you need a refill on your cardiac medications before your next appointment, please call your pharmacy*   Lab Work: Not needed    Testing/Procedures:  Not needed  Follow-Up: At [facility], you and your health needs are our priority.  As part of our continuing mission to provide you with exceptional heart care, we have created designated Provider Care Teams.  These Care Teams include your primary Cardiologist (physician) and Advanced Practice Providers (APPs -  Physician Assistants and Nurse Practitioners) who all work together to provide you with the care you need, when you need it.     Your next appointment:   12 month(s)  The format for your next appointment:   In Person  Provider:   Alm Clay, MD or APP

## 2024-12-21 ENCOUNTER — Encounter: Payer: Self-pay | Admitting: Cardiology
